# Patient Record
Sex: Male | Born: 1990 | Race: White | Hispanic: No | Marital: Single | Smoking: Never smoker
Health system: Southern US, Community
[De-identification: ages and names within clinical notes are randomized; demographics above are authoritative.]

## PROBLEM LIST (undated history)

## (undated) DIAGNOSIS — E079 Disorder of thyroid, unspecified: Secondary | ICD-10-CM

## (undated) DIAGNOSIS — B019 Varicella without complication: Secondary | ICD-10-CM

## (undated) DIAGNOSIS — M549 Dorsalgia, unspecified: Secondary | ICD-10-CM

## (undated) DIAGNOSIS — S0191XA Laceration without foreign body of unspecified part of head, initial encounter: Secondary | ICD-10-CM

## (undated) HISTORY — DX: Laceration without foreign body of unspecified part of head, initial encounter: S01.91XA

## (undated) HISTORY — DX: Disorder of thyroid, unspecified: E07.9

## (undated) HISTORY — DX: Varicella without complication: B01.9

## (undated) HISTORY — DX: Dorsalgia, unspecified: M54.9

---
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
## 2008-05-05 HISTORY — PX: WRIST SURGERY: SHX841

## 2008-07-14 ENCOUNTER — Ambulatory Visit: Payer: Self-pay | Admitting: Internal Medicine

## 2008-07-14 DIAGNOSIS — J310 Chronic rhinitis: Secondary | ICD-10-CM | POA: Insufficient documentation

## 2008-07-14 DIAGNOSIS — J309 Allergic rhinitis, unspecified: Secondary | ICD-10-CM | POA: Insufficient documentation

## 2008-07-14 DIAGNOSIS — J45909 Unspecified asthma, uncomplicated: Secondary | ICD-10-CM | POA: Insufficient documentation

## 2008-07-17 ENCOUNTER — Ambulatory Visit: Payer: Self-pay | Admitting: Internal Medicine

## 2008-08-14 ENCOUNTER — Telehealth (INDEPENDENT_AMBULATORY_CARE_PROVIDER_SITE_OTHER): Payer: Self-pay | Admitting: *Deleted

## 2009-01-08 ENCOUNTER — Inpatient Hospital Stay (HOSPITAL_COMMUNITY): Admission: AC | Admit: 2009-01-08 | Discharge: 2009-01-14 | Payer: Self-pay | Admitting: Emergency Medicine

## 2009-05-12 ENCOUNTER — Ambulatory Visit (HOSPITAL_COMMUNITY): Admission: RE | Admit: 2009-05-12 | Discharge: 2009-05-12 | Payer: Self-pay | Admitting: Orthopedic Surgery

## 2010-03-30 ENCOUNTER — Emergency Department (HOSPITAL_COMMUNITY): Admission: EM | Admit: 2010-03-30 | Discharge: 2010-03-30 | Payer: Self-pay | Admitting: Emergency Medicine

## 2010-07-21 LAB — COMPREHENSIVE METABOLIC PANEL
AST: 21 U/L (ref 0–37)
Alkaline Phosphatase: 82 U/L (ref 39–117)
CO2: 30 mEq/L (ref 19–32)
Chloride: 106 mEq/L (ref 96–112)
Creatinine, Ser: 0.84 mg/dL (ref 0.4–1.5)
GFR calc Af Amer: >60 mL/min (ref >60)
GFR calc non Af Amer: >60 mL/min (ref >60)
Total Bilirubin: 0.9 mg/dL (ref 0.3–1.2)

## 2010-07-21 LAB — CBC
HCT: 41 % (ref 39.0–52.0)
MCV: 89.1 fL (ref 78.0–100.0)
RBC: 4.6 MIL/uL (ref 4.22–5.81)
WBC: 4.9 10*3/uL (ref 4.0–10.5)

## 2010-08-09 LAB — CBC
HCT: 36.2 % — ABNORMAL LOW (ref 39.0–52.0)
HCT: 43.1 % (ref 39.0–52.0)
Hemoglobin: 12.1 g/dL — ABNORMAL LOW (ref 13.0–17.0)
Hemoglobin: 13.3 g/dL (ref 13.0–17.0)
MCHC: 34.8 g/dL (ref 30.0–36.0)
MCV: 90.2 fL (ref 78.0–100.0)
MCV: 90.2 fL (ref 78.0–100.0)
Platelets: 185 10*3/uL (ref 150–400)
Platelets: 216 10*3/uL (ref 150–400)
Platelets: 288 10*3/uL (ref 150–400)
RBC: 4.25 MIL/uL (ref 4.22–5.81)
RDW: 12.3 % (ref 11.5–15.5)
RDW: 12.5 % (ref 11.5–15.5)
WBC: 12.8 10*3/uL — ABNORMAL HIGH (ref 4.0–10.5)
WBC: 14.5 10*3/uL — ABNORMAL HIGH (ref 4.0–10.5)

## 2010-08-09 LAB — URINE CULTURE: Colony Count: NO GROWTH

## 2010-08-09 LAB — DIFFERENTIAL
Basophils Absolute: 0.1 K/uL (ref 0.0–0.1)
Basophils Relative: 1 % (ref 0–1)
Eosinophils Absolute: 0.1 K/uL (ref 0.0–0.7)
Eosinophils Relative: 1 % (ref 0–5)
Lymphocytes Relative: 10 % — ABNORMAL LOW (ref 12–46)
Lymphs Abs: 1.5 K/uL (ref 0.7–4.0)
Monocytes Absolute: 0.8 K/uL (ref 0.1–1.0)
Monocytes Relative: 6 % (ref 3–12)
Neutro Abs: 12 K/uL — ABNORMAL HIGH (ref 1.7–7.7)
Neutrophils Relative %: 83 % — ABNORMAL HIGH (ref 43–77)

## 2010-08-09 LAB — URINALYSIS, MICROSCOPIC ONLY
Glucose, UA: NEGATIVE mg/dL
Hgb urine dipstick: NEGATIVE
Ketones, ur: NEGATIVE mg/dL
Leukocytes, UA: NEGATIVE
Protein, ur: NEGATIVE mg/dL
pH: 8.5 — ABNORMAL HIGH (ref 5.0–8.0)

## 2010-08-09 LAB — BASIC METABOLIC PANEL
BUN: 5 mg/dL — ABNORMAL LOW (ref 6–23)
CO2: 25 mEq/L (ref 19–32)
Calcium: 9.3 mg/dL (ref 8.4–10.5)
Chloride: 105 mEq/L (ref 96–112)
Creatinine, Ser: 1.01 mg/dL (ref 0.4–1.5)
GFR calc Af Amer: >60 mL/min (ref >60)
GFR calc non Af Amer: >60 mL/min (ref >60)
Potassium: 3.4 mEq/L — ABNORMAL LOW (ref 3.5–5.1)
Potassium: 3.9 mEq/L (ref 3.5–5.1)
Sodium: 139 mEq/L (ref 135–145)

## 2010-08-15 ENCOUNTER — Emergency Department (HOSPITAL_COMMUNITY): Payer: No Typology Code available for payment source

## 2010-08-15 ENCOUNTER — Encounter (HOSPITAL_COMMUNITY): Payer: Self-pay | Admitting: Radiology

## 2010-08-15 ENCOUNTER — Observation Stay (HOSPITAL_COMMUNITY)
Admission: EM | Admit: 2010-08-15 | Discharge: 2010-08-16 | Disposition: A | Discharge status: Home / Self Care | Payer: No Typology Code available for payment source | Attending: General Surgery | Admitting: General Surgery

## 2010-08-15 DIAGNOSIS — R079 Chest pain, unspecified: Secondary | ICD-10-CM | POA: Insufficient documentation

## 2010-08-15 DIAGNOSIS — S060X9A Concussion with loss of consciousness of unspecified duration, initial encounter: Principal | ICD-10-CM | POA: Insufficient documentation

## 2010-08-15 DIAGNOSIS — Y92410 Unspecified street and highway as the place of occurrence of the external cause: Secondary | ICD-10-CM | POA: Insufficient documentation

## 2010-08-15 DIAGNOSIS — M25569 Pain in unspecified knee: Secondary | ICD-10-CM | POA: Insufficient documentation

## 2010-08-15 DIAGNOSIS — S0100XA Unspecified open wound of scalp, initial encounter: Secondary | ICD-10-CM | POA: Insufficient documentation

## 2010-08-15 DIAGNOSIS — J45909 Unspecified asthma, uncomplicated: Secondary | ICD-10-CM | POA: Insufficient documentation

## 2010-08-15 LAB — COMPREHENSIVE METABOLIC PANEL
AST: 37 U/L (ref 0–37)
Albumin: 4.3 g/dL (ref 3.5–5.2)
CO2: 29 mEq/L (ref 19–32)
Calcium: 9.4 mg/dL (ref 8.4–10.5)
Creatinine, Ser: 0.96 mg/dL (ref 0.4–1.5)
GFR calc Af Amer: >60 mL/min (ref >60)
GFR calc non Af Amer: >60 mL/min (ref >60)

## 2010-08-15 LAB — SAMPLE TO BLOOD BANK

## 2010-08-15 LAB — CBC
HCT: 40.9 % (ref 39.0–52.0)
Hemoglobin: 14.3 g/dL (ref 13.0–17.0)
RBC: 4.55 MIL/uL (ref 4.22–5.81)
WBC: 5.1 10*3/uL (ref 4.0–10.5)

## 2010-08-15 LAB — POCT I-STAT, CHEM 8
Chloride: 102 mEq/L (ref 96–112)
HCT: 44 % (ref 39.0–52.0)
Potassium: 4.5 mEq/L (ref 3.5–5.1)

## 2010-08-15 LAB — LACTIC ACID, PLASMA: Lactic Acid, Venous: 1.5 mmol/L (ref 0.5–2.2)

## 2010-08-15 LAB — PROTIME-INR: Prothrombin Time: 12.6 seconds (ref 11.6–15.2)

## 2010-08-16 LAB — BASIC METABOLIC PANEL
BUN: 5 mg/dL — ABNORMAL LOW (ref 6–23)
Chloride: 105 mEq/L (ref 96–112)
Potassium: 3.7 mEq/L (ref 3.5–5.1)
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
## 2010-09-20 NOTE — Op Note (Signed)
NAMEAMON, COSTILLA             ACCOUNT NO.:  [id number]   MEDICAL RECORD NO.:  [id number]          PATIENT TYPE:  AMB   LOCATION:  DSC                          FACILITY:  [facility]   PHYSICIAN:  Loreta Ave, M.D. DATE OF BIRTH:  1990/09/24   DATE OF PROCEDURE:  02/11/2006  DATE OF DISCHARGE:                                 OPERATIVE REPORT   PREOPERATIVE DIAGNOSIS:  Lateral meniscus tear, right knee.   POSTOPERATIVE DIAGNOSIS:  Lateral meniscus tear, right knee.   PROCEDURE:  Right knee exam under anesthesia, arthroscopy, partial lateral  meniscectomy.   SURGEON:  Loreta Ave, MD   ASSISTANT:  Genene Churn. Barry Dienes, [place]   ANESTHESIA:  General.   BLOOD LOSS:  Minimal.   TOURNIQUET:  Not employed.   SPECIMENS:  None.   CULTURES:  None.   COMPLICATIONS:  None.   DRESSING:  Soft compressive.   PROCEDURE:  The patient was brought to the operating room and placed on the  operating table in supine position.  After adequate   anesthesia had been  obtained, knee examined.  Positive lateral McMurray's.  Full motion.  Stable  ligaments.  Tourniquet and leg holder applied.  Leg prepped and draped in  the usual sterile fashion.  Three portals created, 1 superolateral, 1 each  medial and lateral parapatellar.  In flow catheter introduced into the knee,  standard arthroscope introduced, and knee inspected.  Complex tearing,  lateral meniscus, radial tear, extending into the back with part of the  meniscus folded under itself.  Saucerized out to a stable margin over the  region of the radial tear and displaced tears.  This left a bridge of  meniscus in the middle, retaining the posterior and anterior thirds.  Tapered smoothly.  Old meniscal fragments left behind, nice and stable.  Remaining knee examined.  Articular cartilage,  medial meniscus, cruciate ligaments, patellofemoral joint all normal.  Instruments and fluid removed.  Portals of knee injected with Marcaine.  Portals  closed with 4-0 nylon.  Sterile compressive dressing applied.  Anesthesia reversed.  Brought to recovery room.  Tolerated the surgery well.  No complications.      Loreta Ave, M.D.  Electronically Signed     DFM/MEDQ  D:  02/11/2006  T:  02/12/2006  Job:  [id number]

## 2012-12-01 LAB — TSH: TSH: 13.6 u[IU]/mL — AB (ref <=5.90)

## 2013-06-16 LAB — TSH: TSH: 8.31 u[IU]/mL — AB (ref <=5.90)

## 2013-09-03 ENCOUNTER — Emergency Department: Payer: Self-pay

## 2014-01-18 LAB — HEPATIC FUNCTION PANEL
ALK PHOS: 68 U/L (ref 25–125)
ALT: 40 U/L (ref 10–40)
AST: 33 U/L (ref 14–40)

## 2014-01-18 LAB — CBC AND DIFFERENTIAL: Hemoglobin: 15.6 g/dL (ref 13.5–17.5)

## 2014-01-18 LAB — LIPID PANEL
Cholesterol: 213 mg/dL — AB (ref 0–200)
HDL: 53 mg/dL (ref 35–70)
LDL CALC: 131 mg/dL
TRIGLYCERIDES: 147 mg/dL (ref 40–160)

## 2014-01-18 LAB — BASIC METABOLIC PANEL
BUN: 16 mg/dL (ref 4–21)
Creatinine: 1.2 mg/dL (ref 0.6–1.3)
GLUCOSE: 91 mg/dL
Potassium: 4.4 mmol/L (ref 3.4–5.3)
SODIUM: 142 mmol/L (ref 137–147)

## 2014-01-18 LAB — TSH: TSH: 40.92 u[IU]/mL — AB (ref 0.41–5.90)

## 2014-01-26 ENCOUNTER — Ambulatory Visit: Payer: BLUE CROSS/BLUE SHIELD | Admitting: Endocrinology

## 2014-02-01 ENCOUNTER — Encounter: Payer: Self-pay | Admitting: Endocrinology

## 2014-02-01 ENCOUNTER — Ambulatory Visit (INDEPENDENT_AMBULATORY_CARE_PROVIDER_SITE_OTHER): Payer: BLUE CROSS/BLUE SHIELD | Admitting: Endocrinology

## 2014-02-01 ENCOUNTER — Other Ambulatory Visit: Payer: Self-pay | Admitting: Endocrinology

## 2014-02-01 VITALS — BP 118/74 | HR 65 | Resp 14 | Ht 70.25 in | Wt 226.0 lb

## 2014-02-01 DIAGNOSIS — E039 Hypothyroidism, unspecified: Secondary | ICD-10-CM | POA: Insufficient documentation

## 2014-02-01 LAB — T3, FREE: T3, Free: 3.5 pg/mL (ref 2.3–4.2)

## 2014-02-01 LAB — T4, FREE: FREE T4: 0.7 ng/dL (ref 0.60–1.60)

## 2014-02-01 NOTE — Patient Instructions (Signed)
  Labs today. Following labs, would expect that you will be started on levothyroxine.  This is thyroid hormone tablet, to be taken as instructed on an empty stomach separate from other pills and multivitamins.   Please come back for a follow-up appointment in 2 months.  Come off the Testosterone supplements.

## 2014-02-01 NOTE — Progress Notes (Signed)
Pre visit review using our clinic review tool, if applicable. No additional management support is needed unless otherwise documented below in the visit note. 

## 2014-02-01 NOTE — Assessment & Plan Note (Signed)
Discussed with the patient regarding thyroid hormone physiology, symptoms and signs of hypothyroidism and possible autoimmune etiology of hypothyroidism.   His last labs c/w hypothyroidism most likely from autoimmune causes.  Will update his thyroid panel together with thyroid Ab levels.  If TSH >10, then will start him on levothyroxine daily.  Explained correct administration of the medication.  Discussed at length about stopping all supplements use, especially use of Testosterone and discussed harmful effects of such supplements.  He has agreed to stop using them for the next 2 months.   RTC 2 months.

## 2014-02-01 NOTE — Progress Notes (Signed)
Reason for visit: Hypothyroidism  HPI  [name] is a 23 y.o.-year-old male, referred by Annitta Needs and Greig Right PA, from [facility],  for evaluation for hypothyroidism. He is here with a friend.   Patient recalls being diagnosed with hypothyroidism in 2014 following blood work. Following this, his levels were monitored without levothyroxine and he reports that his levels were improved on subsequent check, but still elevated. He was started on a "medication for 30 days " to see if they improve. He reports taking the medication for about 10 days then and hasn't taken any since then. Currently, not on any thyroid hormone. TSH levels this month have been elevated to the 40 range.   Denies any recent change in medical history, hospitalizations. Denies using any iodine or kelp supplements. No steroid use recently. He does lift weights and has been using various supplements for body building ( pre workout Dr Sofie Rower and Mr. Sheppard Plumber, optimum nutrition multivitamin, Testosterone booster and enhancement Testosterone pills and protein supplement). Has been using supplements on and off for the past 6-8 months. Has on Testosterone supplement again for the past month. The patient is a Emergency planning/management officer in [place].   I reviewed pt's thyroid tests: Lab Results  Component Value Date   TSH 40.92* 01/18/2014   TSH 8.31* 06/16/2013   TSH 13.60* 12/01/2012   Other pertinent labs 01/17/14: Total T4 8, T3U 26, FTI 2.1  Review of systems: [ x  ] complains of    [  ] denies [  ] weight gain  [  ] constipation  [  ] fatigue   [  ] dry skin  [  ] cold intolerance [  ] hair loss [  ] noticing any enlargement in size of thyroid [  ] lumps in neck [  ] dysphagia [  ] change in voice [  ]  SOB with lying down.  he denies any family history of  thyroid disorders in. No Family history of thyroid cancer.  No history of XRT to head or neck.   I reviewed his chart and he is otherwise healthy. No past  medical history on file. No past surgical history on file. History   Social History  . Marital Status: Single    Spouse Name: N/A    Number of Children: N/A  . Years of Education: N/A   Occupational History  . Not on file.   Social History Main Topics  . Smoking status: Never Smoker   . Smokeless tobacco: Current User  . Alcohol Use: Yes  . Drug Use: No  . Sexual Activity: Not on file   Other Topics Concern  . Not on file   Social History Narrative  . No narrative on file   No current outpatient prescriptions on file prior to visit.   No current facility-administered medications on file prior to visit.   No Known Allergies No family history on file.   Review of Systems: [x]  complains of  [  ] denies General:   [  ] Recent weight change [  ] Fatigue  [  ] Loss of appetite Eyes: [ x ]  Vision Difficulty [  ]  Eye pain ENT: [  ]  Hearing difficulty [  ]  Difficulty Swallowing CVS: [  ] Chest pain [  ]  Palpitations/Irregular Heart beat [  ]  Shortness of breath lying flat [  ] Swelling of legs Resp: [  ] Frequent Cough [  ]  Shortness of Breath  [  ]  Wheezing GI: [  ] Heartburn  [  ] Nausea or Vomiting  [  ] Diarrhea [  ] Constipation  [  ] Abdominal Pain GU: [  ]  Polyuria  [  ]  nocturia Bones/joints:  [  ]  Muscle aches  [ x ] Joint Pain  [ x ] Bone pain Skin/Hair/Nails: [  ]  Rash  [  ] New stretch marks [  ]  Itching [  ] Hair loss [  ]  Excessive hair growth Reproduction: [  ] Low sexual desire , [  ]  Women: Menstrual cycle problems [  ]  Women: Breast Discharge [  ] Men: Difficulty with erections [  ]  Men: Enlarged Breasts CNS: [  ] Frequent Headaches [  ] Blurry vision [  ] Tremors [  ] Seizures [  ] Loss of consciousness [  ] Localized weakness Endocrine: [  ]  Excess thirst [  ]  Feeling excessively hot [  ]  Feeling excessively cold Heme: [  ]  Easy bruising [  ]  Enlarged glands or lumps in neck Allergy: [  ]  Food allergies [  ] Environmental  allergies  PE: BP 118/74  Pulse 65  Resp 14  Ht 5' 10.25" (1.784 m)  Wt 226 lb (102.513 kg)  BMI 32.21 kg/m2  SpO2 98% Wt Readings from Last 3 Encounters:  02/01/14 226 lb (102.513 kg)  07/14/08 213 lb 4 oz (96.73 kg) (96%*, Z = 1.74)   * Growth percentiles are based on CDC 2-20 Years data.    HEENT: [place]/AT, EOMI, no icterus, no proptosis, no chemosis, no mild lid lag, no retraction, eyes close completely Neck: thyroid gland - smooth, mild enlargement, non-tender, no erythema, no tracheal deviation; negative Pemberton's sign; no lympahadenopathy; no bruits Lungs: good air entry, clear bilaterally Heart: S1&S2 normal, regular rate & rhythm; no murmurs, rubs or gallops Abd: soft, NT, ND, no HSM, +BS, no abnormal striae Ext: min tremor in hands bilaterally, no edema, 2+ DP/PT pulses, good muscle mass Neuro: normal gait, 2+ reflexes bilaterally, normal 5/5 strength, no proximal myopathy  Derm: no pretibial myxoedema/skin dryness   ASSESSMENT: 1. Hypothyroidism, uncontrolled , new diagnosis  PLAN:    Problem List Items Addressed This Visit     Endocrine   Unspecified hypothyroidism - Primary     Discussed with the patient regarding thyroid hormone physiology, symptoms and signs of hypothyroidism and possible autoimmune etiology of hypothyroidism.   His last labs c/w hypothyroidism most likely from autoimmune causes.  Will update his thyroid panel together with thyroid Ab levels.  If TSH >10, then will start him on levothyroxine daily.  Explained correct administration of the medication.  Discussed at length about stopping all supplements use, especially use of Testosterone and discussed harmful effects of such supplements.  He has agreed to stop using them for the next 2 months.   RTC 2 months.     Relevant Orders      Thyroid peroxidase antibody      T4, free      T3, free      Thyroid Panel With TSH       -RTC in 2 months   Blayne Frankie [facility]  02/01/2014    2:18 PM

## 2014-02-02 ENCOUNTER — Other Ambulatory Visit: Payer: Self-pay | Admitting: Endocrinology

## 2014-02-02 DIAGNOSIS — E038 Other specified hypothyroidism: Secondary | ICD-10-CM

## 2014-02-02 LAB — THYROID PEROXIDASE ANTIBODY: THYROID PEROXIDASE ANTIBODY: 299 [IU]/mL — AB (ref <9)

## 2014-02-02 LAB — THYROID PANEL WITH TSH
FREE THYROXINE INDEX: 1.6 (ref 1.4–3.8)
T3 UPTAKE: 25 % (ref 22.0–35.0)
T4 TOTAL: 6.4 ug/dL (ref 4.5–12.0)
TSH: 9.147 u[IU]/mL — ABNORMAL HIGH (ref 0.350–4.500)

## 2014-02-02 MED ORDER — LEVOTHYROXINE SODIUM 50 MCG PO TABS
50.0000 ug | ORAL_TABLET | Freq: Every day | ORAL | Status: DC
Start: 2014-02-02 — End: 2014-04-04

## 2014-04-04 ENCOUNTER — Encounter (INDEPENDENT_AMBULATORY_CARE_PROVIDER_SITE_OTHER): Payer: Self-pay

## 2014-04-04 ENCOUNTER — Encounter: Payer: Self-pay | Admitting: Endocrinology

## 2014-04-04 ENCOUNTER — Ambulatory Visit (INDEPENDENT_AMBULATORY_CARE_PROVIDER_SITE_OTHER): Payer: BLUE CROSS/BLUE SHIELD | Admitting: Endocrinology

## 2014-04-04 ENCOUNTER — Other Ambulatory Visit: Payer: Self-pay | Admitting: Endocrinology

## 2014-04-04 VITALS — BP 118/72 | HR 78 | Wt 229.5 lb

## 2014-04-04 DIAGNOSIS — E038 Other specified hypothyroidism: Secondary | ICD-10-CM

## 2014-04-04 LAB — TSH: TSH: 3.14 u[IU]/mL (ref 0.35–4.50)

## 2014-04-04 LAB — T4, FREE: Free T4: 0.99 ng/dL (ref 0.60–1.60)

## 2014-04-04 MED ORDER — LEVOTHYROXINE SODIUM 50 MCG PO TABS
50.0000 ug | ORAL_TABLET | Freq: Every day | ORAL | Status: DC
Start: 2014-04-04 — End: 2018-02-04

## 2014-04-04 NOTE — Progress Notes (Signed)
Pre visit review using our clinic review tool, if applicable. No additional management support is needed unless otherwise documented below in the visit note. 

## 2014-04-04 NOTE — Patient Instructions (Signed)
Continue to stay off muscle building supplements.  Continue current levothyroxine.  Labs today.  Adjust dose based on labs.  Please come back for a follow-up appointment in 6 months

## 2014-04-04 NOTE — Assessment & Plan Note (Signed)
  Appears clinically euthyroid with a small goiter , without palpable nodules.  Check labs today for assessing current dose of levothyroxine.  If TSH, free T4 are in normal range, then continue current levothyroxine, otherwise adjust medication per labs.  He was asked to continue to stay off the Testosterone boosters.    RTC 6 months.

## 2014-04-04 NOTE — Progress Notes (Signed)
Reason for visit: Hypothyroidism  HPI  [name] is a [age] y.o.-year-old male, here for follow up of hypothyroidism.  Diagnosed with hypothyroidism in 2014 following blood work. Had taken levothyroxine briefly then. Last seen by  Me 2 months ago. Started on levothyroxine 50 mcg daily (Sept 2015) when TSH was still elevated. Taking it correctly, but at different times of the day per his work schedule. Thinks may have missed only 1 dose so far.   Has not been using any additional OTC supplements since last time.    He does lift weights and had been using various supplements for body building ( pre workout Dr Sofie RowerJekyl and Mr. Sheppard PlumberHyde, optimum nutrition multivitamin, Testosterone booster and enhancement Testosterone pills and protein supplement). 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   I reviewed pt's thyroid tests: Lab Results  Component Value Date   TSH 9.147* 02/01/2014   TSH 40.92* 01/18/2014   TSH 8.31* 06/16/2013   TSH 13.60* 12/01/2012   FREET4 0.70 02/01/2014   Other pertinent labs 01/17/14: Total T4 8, T3U 26, FTI 2.1 02/01/14: TPO elevated at 299.  Review of systems: [ x  ] complains of    [  ] denies [  ] weight gain  [  ] constipation  [  ] fatigue   [  ] dry skin  [  ] cold intolerance [  ] hair loss [  ] noticing any enlargement in size of thyroid [  ] lumps in neck [  ] dysphagia [  ] change in voice [  ]  SOB with lying down.  he denies any family history of  thyroid disorders in. No Family history of thyroid cancer.  No history of XRT to head or neck.   I have reviewed the patient's past medical history, medications and allergies.     Current Outpatient Prescriptions on File Prior to Visit  Medication Sig Dispense Refill  . cetirizine (ZYRTEC) 10 MG tablet Take 10 mg by mouth daily as needed for allergies.    Marland Kitchen. levothyroxine (SYNTHROID, LEVOTHROID) 50 MCG tablet Take 1 tablet (50 mcg total) by mouth daily before breakfast. 30  tablet 6  . Multiple Vitamin (MULTIVITAMIN) tablet Take 1 tablet by mouth daily.     No current facility-administered medications on file prior to visit.   No Known Allergies   PE: BP 118/72 mmHg  Pulse 78  Wt 229 lb 8 oz (104.101 kg)  SpO2 97% Wt Readings from Last 3 Encounters:  04/04/14 229 lb 8 oz (104.101 kg)  02/01/14 226 lb (102.513 kg)  07/14/08 213 lb 4 oz (96.73 kg) (96 %*, Z = 1.74)   * Growth percentiles are based on CDC 2-20 Years data.    HEENT: [place]/AT, EOMI, no icterus, no proptosis, no chemosis, no mild lid lag, no retraction, eyes close completely Neck: thyroid gland - smooth, mild enlargement, non-tender, no erythema, no tracheal deviation; negative Pemberton's sign; no lympahadenopathy; no bruits Lungs: good air entry, clear bilaterally Heart: S1&S2 normal, regular rate & rhythm; no murmurs, rubs or gallops Ext: min tremor in hands bilaterally, no edema, 2+ DP/PT pulses, good muscle mass Neuro: normal gait, 2+ reflexes bilaterally, normal 5/5 strength, no proximal myopathy  Derm: no pretibial myxoedema/skin dryness   ASSESSMENT: 1. Hypothyroidism, uncontrolled   PLAN:    Problem List Items Addressed This Visit      Endocrine   Hypothyroidism - Primary     Appears clinically euthyroid  with a small goiter , without palpable nodules.  Check labs today for assessing current dose of levothyroxine.  If TSH, free T4 are in normal range, then continue current levothyroxine, otherwise adjust medication per labs.  He was asked to continue to stay off the Testosterone boosters.    RTC 6 months.       Relevant Orders      TSH      T4, free       -RTC in 6 months   [name]  04/04/2014   11:26 AM

## 2014-10-04 ENCOUNTER — Ambulatory Visit: Payer: Self-pay | Admitting: Endocrinology

## 2016-07-25 DIAGNOSIS — M5136 Other intervertebral disc degeneration, lumbar region: Secondary | ICD-10-CM | POA: Diagnosis not present

## 2016-07-25 DIAGNOSIS — Z8781 Personal history of (healed) traumatic fracture: Secondary | ICD-10-CM | POA: Diagnosis not present

## 2016-07-25 DIAGNOSIS — G8929 Other chronic pain: Secondary | ICD-10-CM | POA: Diagnosis not present

## 2016-07-25 DIAGNOSIS — M5442 Lumbago with sciatica, left side: Secondary | ICD-10-CM | POA: Diagnosis not present

## 2016-08-11 DIAGNOSIS — M549 Dorsalgia, unspecified: Secondary | ICD-10-CM | POA: Diagnosis not present

## 2016-08-11 DIAGNOSIS — R030 Elevated blood-pressure reading, without diagnosis of hypertension: Secondary | ICD-10-CM | POA: Diagnosis not present

## 2016-08-11 DIAGNOSIS — Z6827 Body mass index (BMI) 27.0-27.9, adult: Secondary | ICD-10-CM | POA: Diagnosis not present

## 2016-09-10 DIAGNOSIS — R030 Elevated blood-pressure reading, without diagnosis of hypertension: Secondary | ICD-10-CM | POA: Diagnosis not present

## 2016-09-10 DIAGNOSIS — Z6829 Body mass index (BMI) 29.0-29.9, adult: Secondary | ICD-10-CM | POA: Diagnosis not present

## 2016-09-10 DIAGNOSIS — M545 Low back pain: Secondary | ICD-10-CM | POA: Diagnosis not present

## 2016-09-10 DIAGNOSIS — M549 Dorsalgia, unspecified: Secondary | ICD-10-CM | POA: Diagnosis not present

## 2016-10-29 DIAGNOSIS — G8929 Other chronic pain: Secondary | ICD-10-CM | POA: Diagnosis not present

## 2016-10-29 DIAGNOSIS — M549 Dorsalgia, unspecified: Secondary | ICD-10-CM | POA: Diagnosis not present

## 2016-10-29 DIAGNOSIS — M545 Low back pain: Secondary | ICD-10-CM | POA: Diagnosis not present

## 2016-10-30 DIAGNOSIS — M545 Low back pain: Secondary | ICD-10-CM | POA: Diagnosis not present

## 2016-12-01 DIAGNOSIS — M545 Low back pain: Secondary | ICD-10-CM | POA: Diagnosis not present

## 2016-12-23 DIAGNOSIS — M545 Low back pain: Secondary | ICD-10-CM | POA: Diagnosis not present

## 2016-12-23 DIAGNOSIS — G8929 Other chronic pain: Secondary | ICD-10-CM | POA: Diagnosis not present

## 2016-12-23 DIAGNOSIS — Z6830 Body mass index (BMI) 30.0-30.9, adult: Secondary | ICD-10-CM | POA: Diagnosis not present

## 2017-01-08 DIAGNOSIS — K1321 Leukoplakia of oral mucosa, including tongue: Secondary | ICD-10-CM | POA: Diagnosis not present

## 2017-02-06 ENCOUNTER — Ambulatory Visit: Payer: Self-pay | Admitting: Registered Nurse

## 2017-02-06 VITALS — BP 141/83 | HR 91 | Temp 98.5°F | Resp 16

## 2017-02-06 DIAGNOSIS — J301 Allergic rhinitis due to pollen: Secondary | ICD-10-CM

## 2017-02-06 DIAGNOSIS — J0101 Acute recurrent maxillary sinusitis: Secondary | ICD-10-CM

## 2017-02-06 DIAGNOSIS — H6692 Otitis media, unspecified, left ear: Secondary | ICD-10-CM

## 2017-02-06 MED ORDER — SALINE SPRAY 0.65 % NA SOLN: 1.0000 | NASAL | Status: DC | PRN

## 2017-02-06 MED ORDER — AMOXICILLIN-POT CLAVULANATE 875-125 MG PO TABS: 1.0000 | ORAL_TABLET | Freq: Two times a day (BID) | ORAL | 0 refills | Status: AC

## 2017-02-06 MED ORDER — ACETAMINOPHEN 500 MG PO TABS: 1000.0000 mg | ORAL_TABLET | Freq: Four times a day (QID) | ORAL | 0 refills | Status: AC | PRN

## 2017-02-06 MED ORDER — MONTELUKAST SODIUM 10 MG PO TABS: 10.0000 mg | ORAL_TABLET | Freq: Every day | ORAL | 3 refills | Status: DC

## 2017-02-06 NOTE — Patient Instructions (Addendum)
Otitis Media, Adult Otitis media is redness, soreness, and puffiness (swelling) in the space just behind your eardrum (middle ear). It may be caused by allergies or infection. It often happens along with a cold. Follow these instructions at home:  Take your medicine as told. Finish it even if you start to feel better.  Only take over-the-counter or prescription medicines for pain, discomfort, or fever as told by your doctor.  Follow up with your doctor as told. Contact a doctor if:  You have otitis media only in one ear, or bleeding from your nose, or both.  You notice a lump on your neck.  You are not getting better in 3-5 days.  You feel worse instead of better. Get help right away if:  You have pain that is not helped with medicine.  You have puffiness, redness, or pain around your ear.  You get a stiff neck.  You cannot move part of your face (paralysis).  You notice that the bone behind your ear hurts when you touch it. This information is not intended to replace advice given to you by your health care provider. Make sure you discuss any questions you have with your health care provider. Document Released: 10/08/2007 Document Revised: 09/27/2015 Document Reviewed: 11/16/2012 Elsevier Interactive Patient Education  2017 [employer] Sinusitis, Adult Sinusitis is soreness and inflammation of your sinuses. Sinuses are hollow spaces in the bones around your face. Your sinuses are located:  Around your eyes.  In the middle of your forehead.  Behind your nose.  In your cheekbones.  Your sinuses and nasal passages are lined with a stringy fluid (mucus). Mucus normally drains out of your sinuses. When your nasal tissues become inflamed or swollen, the mucus can become trapped or blocked so air cannot flow through your sinuses. This allows bacteria, viruses, and funguses to grow, which leads to infection. Sinusitis can develop quickly and last for 7?10 days (acute) or for  more than 12 weeks (chronic). Sinusitis often develops after a cold. What are the causes? This condition is caused by anything that creates swelling in the sinuses or stops mucus from draining, including:  Allergies.  Asthma.  Bacterial or viral infection.  Abnormally shaped bones between the nasal passages.  Nasal growths that contain mucus (nasal polyps).  Narrow sinus openings.  Pollutants, such as chemicals or irritants in the air.  A foreign object stuck in the nose.  A fungal infection. This is rare.  What increases the risk? The following factors may make you more likely to develop this condition:  Having allergies or asthma.  Having had a recent cold or respiratory tract infection.  Having structural deformities or blockages in your nose or sinuses.  Having a weak immune system.  Doing a lot of swimming or diving.  Overusing nasal sprays.  Smoking.  What are the signs or symptoms? The main symptoms of this condition are pain and a feeling of pressure around the affected sinuses. Other symptoms include:  Upper toothache.  Earache.  Headache.  Bad breath.  Decreased sense of smell and taste.  A cough that may get worse at night.  Fatigue.  Fever.  Thick drainage from your nose. The drainage is often green and it may contain pus (purulent).  Stuffy nose or congestion.  Postnasal drip. This is when extra mucus collects in the throat or back of the nose.  Swelling and warmth over the affected sinuses.  Sore throat.  Sensitivity to light.  How is this diagnosed?  This condition is diagnosed based on symptoms, a medical history, and a physical exam. To find out if your condition is acute or chronic, your health care provider may:  Look in your nose for signs of nasal polyps.  Tap over the affected sinus to check for signs of infection.  View the inside of your sinuses using an imaging device that has a light attached (endoscope).  If your  health care provider suspects that you have chronic sinusitis, you may also:  Be tested for allergies.  Have a sample of mucus taken from your nose (nasal culture) and checked for bacteria.  Have a mucus sample examined to see if your sinusitis is related to an allergy.  If your sinusitis does not respond to treatment and it lasts longer than 8 weeks, you may have an MRI or CT scan to check your sinuses. These scans also help to determine how severe your infection is. In rare cases, a bone biopsy may be done to rule out more serious types of fungal sinus disease. How is this treated? Treatment for sinusitis depends on the cause and whether your condition is chronic or acute. If a virus is causing your sinusitis, your symptoms will go away on their own within 10 days. You may be given medicines to relieve your symptoms, including:  Topical nasal decongestants. They shrink swollen nasal passages and let mucus drain from your sinuses.  Antihistamines. These drugs block inflammation that is triggered by allergies. This can help to ease swelling in your nose and sinuses.  Topical nasal corticosteroids. These are nasal sprays that ease inflammation and swelling in your nose and sinuses.  Nasal saline washes. These rinses can help to get rid of thick mucus in your nose.  If your condition is caused by bacteria, you will be given an antibiotic medicine. If your condition is caused by a fungus, you will be given an antifungal medicine. Surgery may be needed to correct underlying conditions, such as narrow nasal passages. Surgery may also be needed to remove polyps. Follow these instructions at home: Medicines  Take, use, or apply over-the-counter and prescription medicines only as told by your health care provider. These may include nasal sprays.  If you were prescribed an antibiotic medicine, take it as told by your health care provider. Do not stop taking the antibiotic even if you start to feel  better. Hydrate and Humidify  Drink enough water to keep your urine clear or pale yellow. Staying hydrated will help to thin your mucus.  Use a cool mist humidifier to keep the humidity level in your home above 50%.  Inhale steam for 10-15 minutes, 3-4 times a day or as told by your health care provider. You can do this in the bathroom while a hot shower is running.  Limit your exposure to cool or dry air. Rest  Rest as much as possible.  Sleep with your head raised (elevated).  Make sure to get enough sleep each night. General instructions  Apply a warm, moist washcloth to your face 3-4 times a day or as told by your health care provider. This will help with discomfort.  Wash your hands often with soap and water to reduce your exposure to viruses and other germs. If soap and water are not available, use hand sanitizer.  Do not smoke. Avoid being around people who are smoking (secondhand smoke).  Keep all follow-up visits as told by your health care provider. This is important. Contact a health care provider  if:  You have a fever.  Your symptoms get worse.  Your symptoms do not improve within 10 days. Get help right away if:  You have a severe headache.  You have persistent vomiting.  You have pain or swelling around your face or eyes.  You have vision problems.  You develop confusion.  Your neck is stiff.  You have trouble breathing. This information is not intended to replace advice given to you by your health care provider. Make sure you discuss any questions you have with your health care provider. Document Released: 04/21/2005 Document Revised: 12/16/2015 Document Reviewed: 02/14/2015 Elsevier Interactive Patient Education  2017 [employer]. Sinus Rinse What is a sinus rinse? A sinus rinse is a simple home treatment that is used to rinse your sinuses with a sterile mixture of salt and water (saline solution). Sinuses are air-filled spaces in your skull  behind the bones of your face and forehead that open into your nasal cavity. You will use the following:  Saline solution.  Neti pot or spray bottle. This releases the saline solution into your nose and through your sinuses. Neti pots and spray bottles can be purchased at [employer], a health food store, or online.  When would I do a sinus rinse? A sinus rinse can help to clear mucus, dirt, dust, or pollen from the nasal cavity. You may do a sinus rinse when you have a cold, a virus, nasal allergy symptoms, a sinus infection, or stuffiness in the nose or sinuses. If you are considering a sinus rinse:  Ask your child's health care provider before performing a sinus rinse on your child.  Do not do a sinus rinse if you have had ear or nasal surgery, ear infection, or blocked ears.  How do I do a sinus rinse?  Wash your hands.  Disinfect your device according to the directions provided and then dry it.  Use the solution that comes with your device or one that is sold separately in stores. Follow the mixing directions on the package.  Fill your device with the amount of saline solution as directed by the device instructions.  Stand over a sink and tilt your head sideways over the sink.  Place the spout of the device in your upper nostril (the one closer to the ceiling).  Gently pour or squeeze the saline solution into the nasal cavity. The liquid should drain to the lower nostril if you are not overly congested.  Gently blow your nose. Blowing too hard may cause ear pain.  Repeat in the other nostril.  Clean and rinse your device with clean water and then air-dry it. Are there risks of a sinus rinse? Sinus rinse is generally very safe and effective. However, there are a few risks, which include:  A burning sensation in the sinuses. This may happen if you do not make the saline solution as directed. Make sure to follow all directions when making the saline  solution.  Infection from contaminated water. This is rare, but possible.  Nasal irritation.  This information is not intended to replace advice given to you by your health care provider. Make sure you discuss any questions you have with your health care provider. Document Released: 11/16/2013 Document Revised: 03/18/2016 Document Reviewed: 09/06/2013 Elsevier Interactive Patient Education  2017 [employer].

## 2017-02-06 NOTE — Progress Notes (Signed)
Subjective:    Patient ID: [name], male    DOB: 1991-04-07, 26 y.o.   MRN: [id number]  26y/o caucasian male new patient here for evaluation sinus pain pressure sore throat and ear pain x 1 week.  I have bad sinuses and allergies I switching from claritin to zyrtec, use flonase nasal and saline rinses year round. I also tried OTC chlortrimeton without any relief of symptoms. Tylenol and aleve at home for prn use. I can usually control it with this regimen. Has been years since he needed antibiotics--doxycycline and augmentin without problems in the past.  Works for [employer].  Denied need for work notes feels able to complete all duties      Review of Systems  Constitutional: Negative for activity change, appetite change, chills, diaphoresis, fatigue, fever and unexpected weight change.  HENT: Positive for congestion, ear pain, postnasal drip, rhinorrhea, sinus pain, sinus pressure and sore throat. Negative for dental problem, drooling, ear discharge, facial swelling, hearing loss, mouth sores, nosebleeds, sneezing, tinnitus, trouble swallowing and voice change.   Eyes: Negative for photophobia, pain, discharge, redness, itching and visual disturbance.  Respiratory: Negative for cough, choking, chest tightness, shortness of breath, wheezing and stridor.   Cardiovascular: Negative for chest pain, palpitations and leg swelling.  Gastrointestinal: Negative for abdominal distention, abdominal pain, blood in stool, constipation, diarrhea, nausea and vomiting.  Endocrine: Negative for cold intolerance and heat intolerance.  Genitourinary: Negative for dysuria.  Musculoskeletal: Negative for arthralgias, back pain, gait problem, joint swelling, myalgias, neck pain and neck stiffness.  Skin: Negative for color change, pallor, rash and wound.  Allergic/Immunologic: Positive for environmental allergies. Negative for food allergies and immunocompromised state.  Neurological:  Positive for headaches. Negative for dizziness, tremors, seizures, syncope, facial asymmetry, speech difficulty, weakness, light-headedness and numbness.  Hematological: Negative for adenopathy. Does not bruise/bleed easily.  Psychiatric/Behavioral: Negative for agitation, behavioral problems, confusion and sleep disturbance.       Objective:   Physical Exam  Constitutional: He is oriented to person, place, and time. Vital signs are normal. He appears well-developed and well-nourished. He is active and cooperative.  Non-toxic appearance. He does not have a sickly appearance. He appears ill. No distress.  HENT:  Head: Normocephalic and atraumatic.  Right Ear: Hearing, external ear and ear canal normal. A middle ear effusion is present.  Left Ear: Hearing and ear canal normal. There is tenderness. Tympanic membrane is injected, erythematous and bulging. A middle ear effusion is present.  Nose: Mucosal edema and rhinorrhea present. No nose lacerations, sinus tenderness, nasal deformity, septal deviation or nasal septal hematoma. No epistaxis.  No foreign bodies. Right sinus exhibits maxillary sinus tenderness. Right sinus exhibits no frontal sinus tenderness. Left sinus exhibits maxillary sinus tenderness. Left sinus exhibits no frontal sinus tenderness.  Mouth/Throat: Uvula is midline and mucous membranes are normal. Mucous membranes are not pale, not dry and not cyanotic. He does not have dentures. No oral lesions. No trismus in the jaw. Normal dentition. No dental abscesses, uvula swelling, lacerations or dental caries. Posterior oropharyngeal edema and posterior oropharyngeal erythema present. No oropharyngeal exudate or tonsillar abscesses.  Cobblestoning posterior pharynx; bilateral allergic shiners; oropharynx macular erythema; bilateral nasal turbinates edema/erythema clear discharge; left TM and auditory canal with erythema, bulging and tenderness with insertion otoscope; air fluid level  bilateral TMs clear  Eyes: Pupils are equal, round, and reactive to light. Conjunctivae, EOM and lids are normal. Right eye exhibits no chemosis, no discharge, no exudate and  no hordeolum. No foreign body present in the right eye. Left eye exhibits no chemosis, no discharge, no exudate and no hordeolum. No foreign body present in the left eye. Right conjunctiva is not injected. Right conjunctiva has no hemorrhage. Left conjunctiva is not injected. Left conjunctiva has no hemorrhage. No scleral icterus. Right eye exhibits normal extraocular motion and no nystagmus. Left eye exhibits normal extraocular motion and no nystagmus. Right pupil is round and reactive. Left pupil is round and reactive. Pupils are equal.  Neck: Trachea normal, normal range of motion and phonation normal. Neck supple. No tracheal tenderness and no muscular tenderness present. No neck rigidity. No tracheal deviation, no edema, no erythema and normal range of motion present. No thyroid mass and no thyromegaly present.  Cardiovascular: Normal rate, regular rhythm, S1 normal, S2 normal, normal heart sounds and intact distal pulses.  PMI is not displaced.  Exam reveals no gallop and no friction rub.   No murmur heard. Pulmonary/Chest: Effort normal and breath sounds normal. No accessory muscle usage or stridor. No respiratory distress. He has no decreased breath sounds. He has no wheezes. He has no rhonchi. He has no rales.  Spoke full sentences without difficulty; no cough observed in exam room  Abdominal: Soft. Normal appearance. He exhibits no distension, no fluid wave and no ascites. There is no rigidity and no guarding.  Musculoskeletal: Normal range of motion. He exhibits no edema or tenderness.       Right shoulder: Normal.       Left shoulder: Normal.       Right elbow: Normal.      Left elbow: Normal.       Right hip: Normal.       Left hip: Normal.       Right knee: Normal.       Left knee: Normal.       Cervical back:  Normal.       Thoracic back: Normal.       Lumbar back: Normal.       Right hand: Normal.       Left hand: Normal.  Lymphadenopathy:       Head (right side): No submental, no submandibular, no tonsillar, no preauricular, no posterior auricular and no occipital adenopathy present.       Head (left side): No submental, no submandibular, no tonsillar, no preauricular, no posterior auricular and no occipital adenopathy present.    He has no cervical adenopathy.       Right cervical: No superficial cervical, no deep cervical and no posterior cervical adenopathy present.      Left cervical: No superficial cervical, no deep cervical and no posterior cervical adenopathy present.  Neurological: He is alert and oriented to person, place, and time. He has normal strength. He displays no atrophy and no tremor. No cranial nerve deficit or sensory deficit. He exhibits normal muscle tone. He displays no seizure activity. Coordination and gait normal. GCS eye subscore is 4. GCS verbal subscore is 5. GCS motor subscore is 6.  On/off exam table without difficulty; gait sure and stable in hall  Skin: Skin is warm, dry and intact. No abrasion, no bruising, no burn, no ecchymosis, no laceration, no lesion, no petechiae and no rash noted. He is not diaphoretic. No cyanosis or erythema. No pallor. Nails show no clubbing.  Psychiatric: He has a normal mood and affect. His speech is normal and behavior is normal. Judgment and thought content normal. Cognition and memory are normal.  Nursing note and vitals reviewed.         Assessment & Plan:  A-acute maxillary sinusitis and left otitis media, seasonal allergic rhinitis  P-Continue flonase 1 spray each nostril BID, increase frequency saline 2 sprays each nostril q2h wa prn congestion.  augmentin  po BID x 10 days #20 RF0.  Electronic Rx given.  Denied personal or family history of ENT cancer.  Consider switching to nasacort if no improvement with singulair and  increased frequency nasal saline after 1 month trial/completing antibiotics. Shower BID especially prior to bed. No evidence of systemic bacterial infection, non toxic and well hydrated.  I do not see where any further testing or imaging is necessary at this time.   I will suggest supportive care, rest, good hygiene and encourage the patient to take adequate fluids.  The patient is to return to clinic or EMERGENCY ROOM if symptoms worsen or change significantly.  Exitcare handout on sinusitis and sinus rinse given to patient.  Patient verbalized agreement and understanding of treatment plan and had no further questions at this time.    P2:  Hand washing and cover cough   Electronic Rx singulair  po qhs #30 RF3 to pharmacy of his choice.  Patient may use normal saline nasal spray 2 sprays each nostril q2h wa as needed. flonase 1 spray each nostril BID #1 RF0.  Patient denied personal or family history of ENT cancer.  OTC antihistamine of choice claritin/zyrtec  po daily.  Avoid triggers if possible.  Shower prior to bedtime if exposed to triggers.  If allergic dust/dust mites recommend mattress/pillow covers/encasements; washing linens, vacuuming, sweeping, dusting weekly.  Call or return to clinic as needed if these symptoms worsen or fail to improve as anticipated.   Exitcare handout on allergic rhinitis and sinus rinse given to patient.  Patient verbalized understanding of instructions, agreed with plan of care and had no further questions at this time.  P2:  Avoidance and hand washing.  Augmentin  po BID x 10 days #20 RF0 electronic Rx.  Tylenol  po QID prn pain and/or aleve  po BID prn pain.  Supportive treatment.   No evidence of invasive bacterial infection, non toxic and well hydrated.  This is most likely self limiting viral infection.  I do not see where any further testing or imaging is necessary at this time.   I will suggest supportive care, rest, good hygiene and  encourage the patient to take adequate fluids.  The patient is to return to clinic or EMERGENCY ROOM if symptoms worsen or change significantly e.g. ear pain, fever, purulent discharge from ears or bleeding.  Exitcare handout on otitis media given to patient.  Patient verbalized agreement and understanding of treatment plan.  Patient had no further questions at this time.

## 2018-02-03 ENCOUNTER — Ambulatory Visit: Payer: BLUE CROSS/BLUE SHIELD | Admitting: Family Medicine

## 2018-02-03 ENCOUNTER — Encounter: Payer: Self-pay | Admitting: Family Medicine

## 2018-02-03 ENCOUNTER — Other Ambulatory Visit (HOSPITAL_COMMUNITY)
Admission: RE | Admit: 2018-02-03 | Discharge: 2018-02-03 | Disposition: A | Discharge status: Home / Self Care | Payer: BLUE CROSS/BLUE SHIELD | Source: Ambulatory Visit | Attending: Family Medicine | Admitting: Family Medicine

## 2018-02-03 VITALS — BP 110/82 | HR 73 | Temp 97.8°F | Ht 72.0 in | Wt 238.4 lb

## 2018-02-03 DIAGNOSIS — Z113 Encounter for screening for infections with a predominantly sexual mode of transmission: Secondary | ICD-10-CM

## 2018-02-03 DIAGNOSIS — Z13220 Encounter for screening for lipoid disorders: Secondary | ICD-10-CM

## 2018-02-03 DIAGNOSIS — Z114 Encounter for screening for human immunodeficiency virus [HIV]: Secondary | ICD-10-CM

## 2018-02-03 DIAGNOSIS — M545 Low back pain, unspecified: Secondary | ICD-10-CM

## 2018-02-03 DIAGNOSIS — Z118 Encounter for screening for other infectious and parasitic diseases: Secondary | ICD-10-CM | POA: Diagnosis not present

## 2018-02-03 DIAGNOSIS — M549 Dorsalgia, unspecified: Secondary | ICD-10-CM | POA: Insufficient documentation

## 2018-02-03 DIAGNOSIS — E038 Other specified hypothyroidism: Secondary | ICD-10-CM | POA: Diagnosis not present

## 2018-02-03 DIAGNOSIS — J4520 Mild intermittent asthma, uncomplicated: Secondary | ICD-10-CM | POA: Diagnosis not present

## 2018-02-03 DIAGNOSIS — R5383 Other fatigue: Secondary | ICD-10-CM | POA: Diagnosis not present

## 2018-02-03 DIAGNOSIS — G8929 Other chronic pain: Secondary | ICD-10-CM

## 2018-02-03 DIAGNOSIS — J310 Chronic rhinitis: Secondary | ICD-10-CM

## 2018-02-03 LAB — COMPREHENSIVE METABOLIC PANEL
ALT: 48 U/L (ref 0–53)
AST: 27 U/L (ref 0–37)
Albumin: 4.7 g/dL (ref 3.5–5.2)
Alkaline Phosphatase: 53 U/L (ref 39–117)
BUN: 17 mg/dL (ref 6–23)
CALCIUM: 9.5 mg/dL (ref 8.4–10.5)
CHLORIDE: 102 meq/L (ref 96–112)
CO2: 27 meq/L (ref 19–32)
Creatinine, Ser: 1.02 mg/dL (ref 0.40–1.50)
GFR: 91.74 mL/min (ref >60.00)
GLUCOSE: 95 mg/dL (ref 70–99)
Potassium: 4 mEq/L (ref 3.5–5.1)
Sodium: 138 mEq/L (ref 135–145)
Total Bilirubin: 0.8 mg/dL (ref 0.2–1.2)
Total Protein: 7.1 g/dL (ref 6.0–8.3)

## 2018-02-03 LAB — LIPID PANEL
Cholesterol: 211 mg/dL — ABNORMAL HIGH (ref 0–200)
HDL: 44.8 mg/dL (ref >39.00)
LDL CALC: 144 mg/dL — AB (ref 0–99)
NONHDL: 166.56
Total CHOL/HDL Ratio: 5
Triglycerides: 112 mg/dL (ref 0.0–149.0)
VLDL: 22.4 mg/dL (ref 0.0–40.0)

## 2018-02-03 LAB — CBC
HCT: 41.7 % (ref 39.0–52.0)
HEMOGLOBIN: 14.2 g/dL (ref 13.0–17.0)
MCHC: 34.1 g/dL (ref 30.0–36.0)
MCV: 89.1 fl (ref 78.0–100.0)
PLATELETS: 310 10*3/uL (ref 150.0–400.0)
RBC: 4.67 Mil/uL (ref 4.22–5.81)
RDW: 13 % (ref 11.5–15.5)
WBC: 5.7 10*3/uL (ref 4.0–10.5)

## 2018-02-03 LAB — TSH: TSH: 10.02 u[IU]/mL — AB (ref 0.35–4.50)

## 2018-02-03 MED ORDER — FLUTICASONE PROPIONATE 50 MCG/ACT NA SUSP: 2.0000 | Freq: Every day | NASAL | 6 refills | Status: DC

## 2018-02-03 NOTE — Assessment & Plan Note (Signed)
S:  Started on treatment several years ago (3-4 years). Usually gets bloodwork at work but they have switched nurses. Has seen specialist in past then later went to another doctors office to get refill when doctor moved. Didn't fit well with new doctor and hasnt seen in months and off medication for 3-4 months. Harder to lose weight since stopping. No cold intolerance or constipation.  A/P: will get TSH today with labs and restart levothyroxine- was on 50 mcg previously- if needed

## 2018-02-03 NOTE — Assessment & Plan Note (Signed)
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 Seemed to flare up with working out and hasnt done well since then. Has been to PT, another MD (possible orthopedist), had cortisone shot- helped 25% or so. Bothers him daily at this point. Takes aleve but doesn't want to take daily. No longer locking up like it was. Was told DDD. No radicular pain at present.  A/P: chronic low back pain. will refer to Dr. Berline Chough given long term pain in young healthy patient who wants to remain active to see if he can help with that goal.

## 2018-02-03 NOTE — Patient Instructions (Addendum)
Health Maintenance Due  Topic Date Due  . HIV Screening - today with labs 01/14/2004  . TETANUS/TDAP - unsure of last Tdap date- would you like to just update this today to be on safe side? 01/14/2008  . INFLUENZA VACCINE - declines 12/03/2017   Please schedule a visit with our excellent sports medicine physician Dr. Berline Chough before you leave at the check out desk so he can further evaluate your low back pain. Can also ask him about neck issues if they persist after using heat 20 minutes 3x a day  If thyroid is underactive- we will send in levothyroxine again for you  Checking testosterone as well as STDs at your request.  Trial Flonase for chronic runny nose and congestion in the morning  Lets make sure to see each other at least once a year for physical.  May need to see you sooner depending on labs

## 2018-02-03 NOTE — Assessment & Plan Note (Signed)
S:years of issues with this. Congested each AM- really stopped up and issues during day as well. Switches between antihistamines like zyrtec and claritin. Flonase mild help in past but not using.  A/P: continue antihistamine but add in flonase 2 sprays BID each nostril.

## 2018-02-03 NOTE — Progress Notes (Signed)
Phone: [phone]  Subjective:  Patient presents today to establish care. Last seen by [facility] provider in [place] in 2015- so new patient as over 3 years . Chief complaint-noted.   See problem oriented charting  The following were reviewed and entered/updated in epic: Past Medical History:  Diagnosis Date  . Back pain   . Chicken pox   . Laceration of head    short hospitalization due to this- from [facility]   Patient Active Problem List   Diagnosis Date Noted  . Hypothyroidism 02/01/2014    Priority: Medium  . Back pain 02/03/2018    Priority: Low  . Chronic rhinitis 07/14/2008    Priority: Low  . Asthma 07/14/2008    Priority: Low   Past Surgical History:  Procedure Laterality Date  . KNEE SURGERY Right 2008   meniscus  . WRIST SURGERY  2010   2 on wrist     Family History  Problem Relation Age of Onset  . Alcohol abuse Mother   . Healthy Father   . Healthy Brother   . Asthma Maternal Grandmother   . COPD Maternal Grandmother   . Depression Maternal Grandmother   . Hypertension Maternal Grandmother   . Heart disease Paternal Grandfather        CABG- unknown age  . Thyroid disease Neg Hx     Medications- reviewed and updated Current Outpatient Medications  Medication Sig Dispense Refill  . cetirizine (ZYRTEC) 10 MG tablet Take 10 mg by mouth daily as needed for allergies.    . Multiple Vitamin (MULTIVITAMIN) tablet Take 1 tablet by mouth daily.    Marland Kitchen levothyroxine (SYNTHROID, LEVOTHROID) 50 MCG tablet Take 1 tablet (50 mcg total) by mouth daily before breakfast. (Patient not taking: Reported on 02/03/2018) 30 tablet 6   Allergies-reviewed and updated No Known Allergies  Social History   Social History Narrative   Single but dating- GF few months. Lives with grandparents.       Journalist, newspaper   HS degree.       Hobbies: working out, outdoors    ROS--Full ROS was completed Review of Systems  Constitutional: Positive for  malaise/fatigue. Negative for chills and fever.  HENT: Negative for ear discharge and ear pain.   Eyes: Negative for discharge and redness.  Respiratory: Negative for shortness of breath and wheezing.   Cardiovascular: Negative for chest pain and palpitations.  Gastrointestinal: Negative for abdominal pain, constipation, diarrhea and vomiting.  Genitourinary: Negative for dysuria and urgency.  Musculoskeletal: Positive for back pain and myalgias (left trapezius after workout last week).  Skin: Negative for itching and rash.  Neurological: Negative for dizziness and headaches.  Endo/Heme/Allergies: Negative for polydipsia. Does not bruise/bleed easily.  Psychiatric/Behavioral: Negative for hallucinations and substance abuse.   Objective: BP 110/82 (BP Location: Left Arm, Patient Position: Sitting, Cuff Size: Large)   Pulse 73   Temp 97.8 F (36.6 C) (Oral)   Ht 6' (1.829 m)   Wt 238 lb 6.4 oz (108.1 kg)   SpO2 97%   BMI 32.33 kg/m  Gen: NAD, resting comfortably HEENT: Mucous membranes are moist. Oropharynx normal. TM normal. Eyes: sclera and lids normal, PERRLA Neck: no thyromegaly, no cervical lymphadenopathy CV: RRR no murmurs rubs or gallops Lungs: CTAB no crackles, wheeze, rhonchi Abdomen: soft/nontender/nondistended/normal bowel sounds. No rebound or guarding.  Ext: no edema Skin: warm, dry Neuro: 5/5 strength in upper and lower extremities, normal gait, normal reflexes MSK: some pain with palpation over left trapezius into neck.  ROM limited in neck by some pain. No pain with palpation over low back.   Assessment/Plan:  Other notes: 1.Screen STDs due to unprotected sex 2.  Advised him to quit snuff-he declines for now 3.  Declines flu shot today  Fatigue S: Ask for Testosterone check. Using OTC testosterone boosters. tired off these meds A/P: I advised him no further testosterone boosters.  Fatigue could be coming from thyroid or testosterone at his request.  Slightly  later than desired around 9:15.  Chronic rhinitis S:years of issues with this. Congested each AM- really stopped up and issues during day as well. Switches between antihistamines like zyrtec and claritin. Flonase mild help in past but not using.  A/P: continue antihistamine but add in flonase 2 sprays BID each nostril.    Hypothyroidism S:  Started on treatment several years ago (3-4 years). Usually gets bloodwork at work but they have switched nurses. Has seen specialist in past then later went to another doctors office to get refill when doctor moved. Didn't fit well with new doctor and hasnt seen in months and off medication for 3-4 months. Harder to lose weight since stopping. No cold intolerance or constipation.  A/P: will get TSH today with labs and restart levothyroxine- was on 50 mcg previously- if needed   Back pain S: Fell from deer stand 2010- issues with low back, pelvis, and wrist (2 operations). Seemed to flare up with working out and hasnt done well since then. Has been to PT, another MD (possible orthopedist), had cortisone shot- helped 25% or so. Bothers him daily at this point. Takes aleve but doesn't want to take daily. No longer locking up like it was. Was told DDD. No radicular pain at present.  A/P: chronic low back pain. will refer to Dr. Berline Chough given long term pain in young healthy patient who wants to remain active to see if he can help with that goal.   Lets make sure to see each other at least once a year for physical.  May need to see you sooner depending on labs  Lab/Order associations: Chronic rhinitis - Plan: CBC, Comprehensive metabolic panel  Chronic bilateral low back pain without sciatica - Plan: Ambulatory referral to Sports Medicine, CBC, Comprehensive metabolic panel  Other specified hypothyroidism - Plan: TSH  Mild intermittent asthma without complication  Fatigue, unspecified type - Plan: Testos,Total,Free and SHBG (Male)  Screening for  hyperlipidemia - Plan: Lipid panel  Screening for HIV (human immunodeficiency virus) - Plan: HIV Antibody (routine testing w rflx)  Screening examination for venereal disease - Plan: RPR  Screening for gonorrhea - Plan: Urine cytology ancillary only  Screening for chlamydial disease - Plan: Urine cytology ancillary only  Meds ordered this encounter  Medications  . fluticasone (FLONASE) 50 MCG/ACT nasal spray    Sig: Place 2 sprays into both nostrils daily.    Dispense:  16 g    Refill:  6   Return precautions advised. Tana Conch, MD

## 2018-02-03 NOTE — Addendum Note (Signed)
Addended by: Felix Ahmadi A on: 02/03/2018 09:15 AM   Modules accepted: Orders

## 2018-02-04 ENCOUNTER — Other Ambulatory Visit: Payer: Self-pay | Admitting: Family Medicine

## 2018-02-04 LAB — URINE CYTOLOGY ANCILLARY ONLY
Chlamydia: NEGATIVE
NEISSERIA GONORRHEA: NEGATIVE
Trichomonas: NEGATIVE

## 2018-02-04 MED ORDER — LEVOTHYROXINE SODIUM 50 MCG PO TABS: 50.0000 ug | ORAL_TABLET | Freq: Every day | ORAL | 6 refills | Status: DC

## 2018-02-04 NOTE — Progress Notes (Signed)
Refill levothyroxine

## 2018-02-09 LAB — RPR: RPR Ser Ql: NONREACTIVE

## 2018-02-09 LAB — TESTOS,TOTAL,FREE AND SHBG (FEMALE)
FREE TESTOSTERONE: 52.2 pg/mL (ref 35.0–155.0)
SEX HORMONE BINDING: 19 nmol/L (ref 10–50)
Testosterone, Total, LC-MS-MS: 257 ng/dL (ref 250–1100)

## 2018-02-09 LAB — HIV ANTIBODY (ROUTINE TESTING W REFLEX): HIV: NONREACTIVE

## 2018-02-11 ENCOUNTER — Ambulatory Visit: Payer: BLUE CROSS/BLUE SHIELD | Admitting: Sports Medicine

## 2018-02-12 ENCOUNTER — Other Ambulatory Visit: Payer: Self-pay

## 2018-02-12 DIAGNOSIS — E038 Other specified hypothyroidism: Secondary | ICD-10-CM

## 2018-02-18 ENCOUNTER — Ambulatory Visit: Payer: BLUE CROSS/BLUE SHIELD | Admitting: Sports Medicine

## 2018-02-18 ENCOUNTER — Ambulatory Visit (INDEPENDENT_AMBULATORY_CARE_PROVIDER_SITE_OTHER): Payer: BLUE CROSS/BLUE SHIELD

## 2018-02-18 ENCOUNTER — Encounter: Payer: Self-pay | Admitting: Sports Medicine

## 2018-02-18 VITALS — BP 118/88 | HR 73 | Ht 72.0 in | Wt 236.0 lb

## 2018-02-18 DIAGNOSIS — M24552 Contracture, left hip: Secondary | ICD-10-CM | POA: Diagnosis not present

## 2018-02-18 DIAGNOSIS — M545 Low back pain, unspecified: Secondary | ICD-10-CM

## 2018-02-18 DIAGNOSIS — G8929 Other chronic pain: Secondary | ICD-10-CM

## 2018-02-18 DIAGNOSIS — S3992XA Unspecified injury of lower back, initial encounter: Secondary | ICD-10-CM | POA: Diagnosis not present

## 2018-02-18 DIAGNOSIS — M479 Spondylosis, unspecified: Secondary | ICD-10-CM | POA: Diagnosis not present

## 2018-02-18 NOTE — Progress Notes (Signed)
[name]. [name] [facility] [phone]  [name] - 27 y.o. male MRN [id number]  Date of birth: February 18, 1991  Visit Date: 02/18/2018  PCP: Shelva Majestic, MD   Referred by: Shelva Majestic, MD   Scribe(s) for today's visit: Stevenson Clinch, CMA  SUBJECTIVE:  Ian Homer "Scott" is here for Initial Assessment (LBP)  Referred by: Dr. Tana Conch  HPI: 02/18/2018: His L-sided LBP symptoms INITIALLY: Began in 2010 after falling from a deer stand.  Described as mild aching daily but moderate-severe pinching with certain activities. Pain will occasionally radiate into the L leg.  Worsened with standing for long periods of time.  Improves with lying down.  Additional associated symptoms include: He reports that in the past when he would bend down to pick something up he feels like there is something catching in his back, the pain is so bad when it happens that he has fallen x 2. He denies loss of control of bladder or bowel functions.     At this time symptoms are worsening compared to onset. He has received epidural injection in the past, cannot recall who he saw, reports slight-moderate relief. He has also done PT in the past x 2 mos. He takes Aleve back pain daily with some relief. He has tried using TENs unit in the past with minimal relief. He has tried hot yoga in the past with minimal relief. He has also tried heat/ice with short term relief.   Pertinent hx - fell from deer stand in 2010, injured lower back, pelvis, and wrist. L sacral ala fx, L pubic ramus fx, L minimal acetabular fx, L3 compression/superior end-plate fx, Seen by Dr. Shon Baton and Dr. Amanda Pea. Non-surgical treatment for back/pelvis.  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   REVIEW OF SYSTEMS: Denies night time disturbances. Denies fevers, chills, or night sweats. Denies unexplained weight loss. Denies personal history of  cancer. Denies changes in bowel or bladder habits. Denies recent unreported falls. Denies new or worsening dyspnea or wheezing. Denies headaches or dizziness.  Reports numbness in his legs when sitting down, L>R.  Denies dizziness or presyncopal episodes Denies lower extremity edema    HISTORY:  Prior history reviewed and updated per electronic medical record.  Social History   Occupational History  . Not on file  Tobacco Use  . Smoking status: Never Smoker  . Smokeless tobacco: Current User    Types: Snuff  Substance and Sexual Activity  . Alcohol use: Yes    Alcohol/week: 0.0 - 2.0 standard drinks  . Drug use: No  . Sexual activity: Yes    Birth control/protection: Condom    Comment: not always   Social History   Social History Narrative   Single but dating- GF few months. Lives with grandparents.       Journalist, newspaper   HS degree.       Hobbies: working out, outdoors    Past Medical History:  Diagnosis Date  . Back pain   . Chicken pox   . Laceration of head    short hospitalization due to this- from [facility]   Past Surgical History:  Procedure Laterality Date  . KNEE SURGERY Right 2008   meniscus  . WRIST SURGERY  2010   2 on wrist   family history includes Alcohol abuse in his mother; Asthma in his maternal grandmother; COPD in his maternal grandmother; Depression in his maternal  grandmother; Healthy in his brother and father; Heart disease in his paternal grandfather; Hypertension in his maternal grandmother. There is no history of Thyroid disease.  DATA OBTAINED & REVIEWED:  No results for input(s): HGBA1C, LABURIC, CREATINE in the last 8760 hours. . 09/10/16 - Lumbar spine numbered as per prior MRI. Soft tissue structures are unremarkable. Mild scoliosis concave right. Minimal anterior wedging of L3 and L1 again noted. No interim change. No acute bony abnormality identified. Diffuse multilevel degenerative change .   OBJECTIVE:  VS:  HT:6'  (182.9 cm)   WT:236 lb (107 kg)  BMI:32    BP:118/88  HR:73bpm  TEMP: ( )  RESP:98 %   PHYSICAL EXAM: CONSTITUTIONAL: Well-developed, Well-nourished and In no acute distress PSYCHIATRIC: Alert & appropriately interactive. and Not depressed or anxious appearing. RESPIRATORY: No increased work of breathing and Trachea Midline EYES: Pupils are equal., EOM intact without nystagmus. and No scleral icterus.  VASCULAR EXAM: Warm and well perfused NEURO: unremarkable  MSK Exam: BACK Exam: Normal alignment & Contours Skin: No overlying erythema/ecchymosis  MOTOR TESTING: Intact in all LE myotomes and Able to heel and toe walk without difficutly       RIGHT    LEFT Straight leg raise-------------------------: positive, mild pain                         positive, mild pain Braggard Stretch Test------------------: normal, no pain                         normal, no pain Slump Sign--------------------------------: positive, mild pain                         positive, mild pain Popliteal compression test------------: normal, no pain                         normal, no pain Greater sciatic notch tenderness----: normal, no pain                         normal, no pain   REFLEXES Right Left  DTR - L3/4 -Patellar 2+ 2+  DTR - L5/S1 - Achilles 2+ 2+    ASSESSMENT   1. Chronic left-sided low back pain, unspecified whether sciatica present   2. Chronic bilateral low back pain without sciatica   3. Spondylosis   4. Left hip flexor tightness     PLAN:  Pertinent additional documentation may be included in corresponding procedure notes, imaging studies, problem based documentation and patient instructions.  Procedures:  . None  Medications:  No orders of the defined types were placed in this encounter.  Discussion/Instructions: No problem-specific Assessment & Plan notes found for this encounter.  . Left Hip flexor is tight and given prior sacral base fracture left hip flexor  contracture is likely chronic.  Needs to work on flexibility and core-stabilization.  . Discussed the underlying features of tight hip flexors leading to crouched, fetal like position that results in spinal column compression.  Including lumbar hyperflexion with hypermobility, thoracic flexion with restrictive rotation and cervical lordosis reversal  . Links to [employer] provided today per Patient Instructions.  These exercises were developed by Myles Lipps, DC with a strong emphasis on core neuromuscular reducation and postural realignment through body-weight exercises. . Discussed red flag symptoms that warrant earlier emergent evaluation and patient voices  understanding. . Activity modifications and the importance of avoiding exacerbating activities (limiting pain to no more than a 4 / 10 during or following activity) recommended and discussed.  Follow-up:  . Return in about 3 weeks (around 03/11/2018) for consideration of Osteopathic Manipulation.   . If any lack of improvement consider: further diagnostic evaluation with repeat MRI vs Osteopathic manipulation  . At follow up will plan to consider: ]     CMA/ATC served as scribe during this visit. History, Physical, and Plan performed by medical provider. Documentation and orders reviewed and attested to.      Andrena Mews, DO    [facility] Physician

## 2018-02-22 ENCOUNTER — Encounter: Payer: Self-pay | Admitting: Physical Therapy

## 2018-02-22 ENCOUNTER — Ambulatory Visit: Payer: BLUE CROSS/BLUE SHIELD | Admitting: Physical Therapy

## 2018-02-22 DIAGNOSIS — M545 Low back pain, unspecified: Secondary | ICD-10-CM

## 2018-02-22 DIAGNOSIS — G8929 Other chronic pain: Secondary | ICD-10-CM | POA: Diagnosis not present

## 2018-02-22 NOTE — Patient Instructions (Signed)
Access Code: [email or web address]  URL: [email or web address]  Date: 02/22/2018  Prepared by: Sedalia Muta   Exercises  Supine Single Knee to Chest - 3 reps - 30 hold - 2x daily  Seated Hamstring Stretch - 3 reps - 30 hold - 3x daily  TL Sidebending Stretch - Single Arm Overhead - 3 reps - 30 hold - 2x daily  Supine Posterior Pelvic Tilt - 10 reps - 2 sets - 2x daily

## 2018-02-23 ENCOUNTER — Encounter: Payer: Self-pay | Admitting: Physical Therapy

## 2018-02-23 NOTE — Therapy (Signed)
[facility] [address] [place], [place], [address] Phone: [phone]   Fax:  [phone]  Physical Therapy Evaluation  Patient Details  Name: [name] MRN: [id number] Date of Birth: 1990/09/29 Referring Provider (PT): Gaspar Bidding   Encounter Date: 02/22/2018  PT End of Session - 02/23/18 1333    Visit Number  1    Number of Visits  12    Date for PT Re-Evaluation  04/06/18    Authorization Type  BCBS    PT Start Time  1346    PT Stop Time  1428    PT Time Calculation (min)  42 min    Activity Tolerance  Patient tolerated treatment well    Behavior During Therapy  [facility] for tasks assessed/performed       Past Medical History:  Diagnosis Date  . Back pain   . Chicken pox   . Laceration of head    short hospitalization due to this- from [facility]    Past Surgical History:  Procedure Laterality Date  . KNEE SURGERY Right 2008   meniscus  . WRIST SURGERY  2010   2 on wrist    There were no vitals filed for this visit.   Subjective Assessment - 02/22/18 1351    Subjective  Pt had fall 2010 from deer stand, had multiple fractures.  2013 states he was running, and had increased pain, no injury to report, states increased pain since then.  Pt did have cortisone injection in back about a year ago, has not had significant pain relief.  Pt works full time as Emergency planning/management officer. Works out, cardio, [employer], is unable to do certian things due to back pain.  Pt states pain at L lower back. No LE pain at this time, has had previously.    Patient Stated Goals  Decreased pain     Currently in Pain?  Yes    Pain Score  2     Pain Location  Back    Pain Orientation  Left;Lower    Pain Type  Chronic pain    Pain Onset  More than a month ago    Pain Frequency  Intermittent    Aggravating Factors   Prolonged standing, Bending.     Effect of Pain on Daily Activities  Pain goes up to 8/10.          [facility] PT Assessment -  02/23/18 0001      Assessment   Medical Diagnosis  Back Pain    Referring Provider (PT)  Gaspar Bidding    Prior Therapy  No      Precautions   Precautions  None      Balance Screen   Has the patient fallen in the past 6 months  No      Prior Function   Level of Independence  Independent      Cognition   Overall Cognitive Status  Within Functional Limits for tasks assessed      Posture/Postural Control   Posture Comments  Poor seated posture, with posterior pelvic tilt.       AROM   Overall AROM Comments  Lumbar: flex: mild/mod deficit;  Ext: mild deficit:  L SB: WFL;  R SB : WFL/ pain on L;  HIps: WNL;        Strength   Overall Strength Comments  Hips: 4+/5;  Core: 4-/5;       Palpation   Palpation comment  Significant tightness and  limitation for bil hamstrings; Tightness and mild tenderness at L QL and L paraspinals; Mild soreness with PA mobs at high/mid lumbar region;       Special Tests   Other special tests  Neg SLR bil; Instability noted in lumbar/core;                 Objective measurements completed on examination: See above findings.      [facility] Adult PT Treatment/Exercise - 02/23/18 0001      Exercises   Exercises  Lumbar      Lumbar Exercises: Stretches   Active Hamstring Stretch  3 reps;30 seconds    Active Hamstring Stretch Limitations  seated    Single Knee to Chest Stretch  3 reps;30 seconds    Pelvic Tilt  20 reps    Other Lumbar Stretch Exercise  standing QL stretch 30 sec x3 bil;              PT Education - 02/23/18 1333    Education Details  HEP, PT POC     Person(s) Educated  Patient    Methods  Explanation;Handout;Verbal cues;Tactile cues    Comprehension  Verbalized understanding;Tactile cues required;Need further instruction;Verbal cues required       PT Short Term Goals - 02/23/18 1337      PT SHORT TERM GOAL #1   Title  Pt to be independent with initial HEP     Time  2    Period  Weeks    Status  New     Target Date  03/08/18      PT SHORT TERM GOAL #2   Title  Pt to report decreased pain in lumbar region to 4/10 at worst/ with activity     Time  2    Period  Weeks    Status  New    Target Date  03/08/18        PT Long Term Goals - 02/23/18 1338      PT LONG TERM GOAL #1   Title  Pt to report decreased pain in lumbar region, to 0-2/10 with activity and work duties.     Time  6    Period  Weeks    Status  New    Target Date  04/05/18      PT LONG TERM GOAL #2   Title  Pt to demo improved lumbar ROM to be [facility], to improve posture and ability for bending, lifting.     Time  6    Period  Weeks    Status  New    Target Date  04/05/18      PT LONG TERM GOAL #3   Title  Pt to demo improved strength of core and hips to be at least 4+/5, with no postural changes seen with stability exercises.     Time  6    Period  Weeks    Status  New    Target Date  04/05/18             Plan - 02/23/18 1342    Clinical Impression Statement  Pt presents with primary complaint of increased pain in L lumbar region. Pt with increased tightness in L lumbar musculature, and pain in upper/mid central lumbar region with spring testing and PA mobs. Pt with significant tightness in bil hamstrings, and has tight posterior chain. Pt with poor seated posture, and will benefit from education on this. Pt with lack of effective HEP for back pain.  Pt with decreased ability for full functional actiivities due to pain. Pt to benefit from skilled Pt to improve deficits and pain.     Clinical Presentation  Stable    Clinical Decision Making  Low    Rehab Potential  Good    PT Frequency  2x / week    PT Duration  6 weeks    PT Treatment/Interventions  ADLs/Self Care Home Management;Cryotherapy;Electrical Stimulation;Iontophoresis 4mg /ml Dexamethasone;Moist Heat;Therapeutic activities;Functional mobility training;Gait training;Ultrasound;Therapeutic exercise;Neuromuscular re-education;Patient/family education;Dry  needling;Passive range of motion;Manual techniques;Taping;Joint Manipulations    Consulted and Agree with Plan of Care  Patient       Patient will benefit from skilled therapeutic intervention in order to improve the following deficits and impairments:  Abnormal gait, Hypomobility, Decreased activity tolerance, Decreased strength, Pain, Decreased mobility, Increased muscle spasms, Improper body mechanics, Decreased range of motion, Impaired flexibility  Visit Diagnosis: Chronic left-sided low back pain without sciatica     Problem List Patient Active Problem List   Diagnosis Date Noted  . Back pain 02/03/2018  . Hypothyroidism 02/01/2014  . Chronic rhinitis 07/14/2008  . Asthma 07/14/2008    Sedalia Muta, PT, DPT 1:51 PM  02/23/18    [facility] [address] [place], [place], [address] Phone: [phone]   Fax:  [phone]  Name: [name] MRN: [id number] Date of Birth: 04-02-1991

## 2018-03-01 ENCOUNTER — Encounter: Payer: BLUE CROSS/BLUE SHIELD | Admitting: Physical Therapy

## 2018-03-03 ENCOUNTER — Encounter: Payer: Self-pay | Admitting: Physical Therapy

## 2018-03-03 ENCOUNTER — Ambulatory Visit: Payer: BLUE CROSS/BLUE SHIELD | Admitting: Physical Therapy

## 2018-03-03 DIAGNOSIS — G8929 Other chronic pain: Secondary | ICD-10-CM | POA: Diagnosis not present

## 2018-03-03 DIAGNOSIS — M545 Low back pain: Secondary | ICD-10-CM

## 2018-03-03 NOTE — Therapy (Signed)
[facility] [place] [address] [place], [place], [address] Phone: [phone]   Fax:  [phone]  Physical Therapy Treatment  Patient Details  Name: [name] MRN: [id number] Date of Birth: 01-Oct-1990 Referring Provider (PT): Gaspar Bidding   Encounter Date: 03/03/2018  PT End of Session - 03/03/18 0858    Visit Number  2    Number of Visits  12    Date for PT Re-Evaluation  04/06/18    Authorization Type  BCBS    PT Start Time  [phone]    PT Stop Time  0932    PT Time Calculation (min)  38 min    Activity Tolerance  Patient tolerated treatment well    Behavior During Therapy  [facility] for tasks assessed/performed       Past Medical History:  Diagnosis Date  . Back pain   . Chicken pox   . Laceration of head    short hospitalization due to this- from [facility]    Past Surgical History:  Procedure Laterality Date  . KNEE SURGERY Right 2008   meniscus  . WRIST SURGERY  2010   2 on wrist    There were no vitals filed for this visit.  Subjective Assessment - 03/03/18 0856    Subjective  Pt almost 10 min late to appt. Pt has been doing HEP. Has been able to do elliptical at gym. States minimal change in back pain.     Patient Stated Goals  Decreased pain     Currently in Pain?  Yes    Pain Score  1     Pain Location  Back    Pain Orientation  Left;Lower    Pain Descriptors / Indicators  Aching    Pain Type  Chronic pain    Pain Onset  More than a month ago    Pain Frequency  Intermittent                       [facility] Adult PT Treatment/Exercise - 03/03/18 0855      Posture/Postural Control   Posture Comments  --      Exercises   Exercises  Lumbar      Lumbar Exercises: Stretches   Active Hamstring Stretch  3 reps;30 seconds    Active Hamstring Stretch Limitations  seated    Single Knee to Chest Stretch  3 reps;30 seconds    Pelvic Tilt  20 reps    Other Lumbar Stretch Exercise  standing QL stretch 30 sec  x3 bil;       Lumbar Exercises: Supine   Clam  20 reps    Bent Knee Raise  20 reps    Bridge  20 reps      Manual Therapy   Manual Therapy  Soft tissue mobilization;Joint mobilization    Joint Mobilization  Lumbar PA mobs Gr 3    Soft tissue mobilization  DTM/IASTM to L/R lumbar paraspinals and L QL;                PT Short Term Goals - 02/23/18 1337      PT SHORT TERM GOAL #1   Title  Pt to be independent with initial HEP     Time  2    Period  Weeks    Status  New    Target Date  03/08/18      PT SHORT TERM GOAL #2   Title  Pt to report decreased  pain in lumbar region to 4/10 at worst/ with activity     Time  2    Period  Weeks    Status  New    Target Date  03/08/18        PT Long Term Goals - 02/23/18 1338      PT LONG TERM GOAL #1   Title  Pt to report decreased pain in lumbar region, to 0-2/10 with activity and work duties.     Time  6    Period  Weeks    Status  New    Target Date  04/05/18      PT LONG TERM GOAL #2   Title  Pt to demo improved lumbar ROM to be [facility], to improve posture and ability for bending, lifting.     Time  6    Period  Weeks    Status  New    Target Date  04/05/18      PT LONG TERM GOAL #3   Title  Pt to demo improved strength of core and hips to be at least 4+/5, with no postural changes seen with stability exercises.     Time  6    Period  Weeks    Status  New    Target Date  04/05/18            Plan - 03/03/18 0940    Clinical Impression Statement  Pt with soreness and tightness in L lumbar musculature > R. May benefit from dry needling for multifidus in future. Pt educated on Core contraction today, started with light stabilization. Plan to progress as tolerated.     Rehab Potential  Good    PT Frequency  2x / week    PT Duration  6 weeks    PT Treatment/Interventions  ADLs/Self Care Home Management;Cryotherapy;Electrical Stimulation;Iontophoresis 4mg /ml Dexamethasone;Moist Heat;Therapeutic  activities;Functional mobility training;Gait training;Ultrasound;Therapeutic exercise;Neuromuscular re-education;Patient/family education;Dry needling;Passive range of motion;Manual techniques;Taping;Joint Manipulations    Consulted and Agree with Plan of Care  Patient       Patient will benefit from skilled therapeutic intervention in order to improve the following deficits and impairments:  Abnormal gait, Hypomobility, Decreased activity tolerance, Decreased strength, Pain, Decreased mobility, Increased muscle spasms, Improper body mechanics, Decreased range of motion, Impaired flexibility  Visit Diagnosis: Chronic left-sided low back pain without sciatica     Problem List Patient Active Problem List   Diagnosis Date Noted  . Back pain 02/03/2018  . Hypothyroidism 02/01/2014  . Chronic rhinitis 07/14/2008  . Asthma 07/14/2008    Sedalia Muta, PT, DPT 9:45 AM  03/03/18    [facility] [address] [place], [place], [address] Phone: [phone]   Fax:  [phone]  Name: TYMEL CONELY MRN: [id number] Date of Birth: 1990-07-22

## 2018-03-04 ENCOUNTER — Ambulatory Visit: Payer: BLUE CROSS/BLUE SHIELD | Admitting: Physical Therapy

## 2018-03-04 DIAGNOSIS — M545 Low back pain, unspecified: Secondary | ICD-10-CM

## 2018-03-04 DIAGNOSIS — G8929 Other chronic pain: Secondary | ICD-10-CM

## 2018-03-07 ENCOUNTER — Encounter: Payer: Self-pay | Admitting: Physical Therapy

## 2018-03-07 NOTE — Therapy (Signed)
[facility] [place] [facility] [address] [place], [place], [address] Phone: [phone]   Fax:  [phone]  Physical Therapy Treatment  Patient Details  Name: [name] MRN: [id number] Date of Birth: 24-Jul-1990 Referring Provider (PT): Gaspar Bidding   Encounter Date: 03/04/2018  PT End of Session - 03/07/18 1540    Visit Number  3    Number of Visits  12    Date for PT Re-Evaluation  04/06/18    Authorization Type  BCBS    PT Start Time  1515    PT Stop Time  1558    PT Time Calculation (min)  43 min    Activity Tolerance  Patient tolerated treatment well    Behavior During Therapy  [facility] for tasks assessed/performed       Past Medical History:  Diagnosis Date  . Back pain   . Chicken pox   . Laceration of head    short hospitalization due to this- from [facility]    Past Surgical History:  Procedure Laterality Date  . KNEE SURGERY Right 2008   meniscus  . WRIST SURGERY  2010   2 on wrist    There were no vitals filed for this visit.  Subjective Assessment - 03/07/18 1539    Subjective  Pt states increased paim since yesterday.     Currently in Pain?  Yes    Pain Score  3     Pain Orientation  Left;Lower    Pain Descriptors / Indicators  Aching    Pain Type  Chronic pain    Pain Onset  More than a month ago    Pain Frequency  Intermittent                       [facility] Adult PT Treatment/Exercise - 03/07/18 0001      Exercises   Exercises  Lumbar      Lumbar Exercises: Stretches   Active Hamstring Stretch  3 reps;30 seconds    Active Hamstring Stretch Limitations  seated    Single Knee to Chest Stretch  3 reps;30 seconds      Lumbar Exercises: Supine   Bent Knee Raise  20 reps    Bridge  20 reps    Straight Leg Raise  20 reps    Straight Leg Raises Limitations  with TA, Bil;       Manual Therapy   Manual Therapy  Soft tissue mobilization;Joint mobilization    Joint Mobilization  Lumbar PA mobs Gr 3     Soft tissue mobilization  DTM/IASTM to L/R lumbar paraspinals and L QL;                PT Short Term Goals - 02/23/18 1337      PT SHORT TERM GOAL #1   Title  Pt to be independent with initial HEP     Time  2    Period  Weeks    Status  New    Target Date  03/08/18      PT SHORT TERM GOAL #2   Title  Pt to report decreased pain in lumbar region to 4/10 at worst/ with activity     Time  2    Period  Weeks    Status  New    Target Date  03/08/18        PT Long Term Goals - 02/23/18 1338      PT LONG TERM GOAL #1  Title  Pt to report decreased pain in lumbar region, to 0-2/10 with activity and work duties.     Time  6    Period  Weeks    Status  New    Target Date  04/05/18      PT LONG TERM GOAL #2   Title  Pt to demo improved lumbar ROM to be [facility], to improve posture and ability for bending, lifting.     Time  6    Period  Weeks    Status  New    Target Date  04/05/18      PT LONG TERM GOAL #3   Title  Pt to demo improved strength of core and hips to be at least 4+/5, with no postural changes seen with stability exercises.     Time  6    Period  Weeks    Status  New    Target Date  04/05/18            Plan - 03/07/18 1543    Clinical Impression Statement  Increased soreness in L QL , addressed with manual therapy and IASTM. Pt with noted weakness in core, continued core strengthening and ther ex. Will progress as pt able.     Rehab Potential  Good    PT Frequency  2x / week    PT Duration  6 weeks    PT Treatment/Interventions  ADLs/Self Care Home Management;Cryotherapy;Electrical Stimulation;Iontophoresis 4mg /ml Dexamethasone;Moist Heat;Therapeutic activities;Functional mobility training;Gait training;Ultrasound;Therapeutic exercise;Neuromuscular re-education;Patient/family education;Dry needling;Passive range of motion;Manual techniques;Taping;Joint Manipulations    Consulted and Agree with Plan of Care  Patient       Patient will benefit  from skilled therapeutic intervention in order to improve the following deficits and impairments:  Abnormal gait, Hypomobility, Decreased activity tolerance, Decreased strength, Pain, Decreased mobility, Increased muscle spasms, Improper body mechanics, Decreased range of motion, Impaired flexibility  Visit Diagnosis: Chronic left-sided low back pain without sciatica     Problem List Patient Active Problem List   Diagnosis Date Noted  . Back pain 02/03/2018  . Hypothyroidism 02/01/2014  . Chronic rhinitis 07/14/2008  . Asthma 07/14/2008    Sedalia Muta, PT, DPT 3:45 PM  03/07/18    [facility] [place] [address] [place], [place], [address] Phone: [phone]   Fax:  [phone]  Name: DEUNDRE THONG MRN: [id number] Date of Birth: 11-13-1990

## 2018-03-09 ENCOUNTER — Encounter: Payer: BLUE CROSS/BLUE SHIELD | Admitting: Physical Therapy

## 2018-03-12 ENCOUNTER — Ambulatory Visit: Payer: BLUE CROSS/BLUE SHIELD | Admitting: Physical Therapy

## 2018-03-12 ENCOUNTER — Encounter: Payer: Self-pay | Admitting: Physical Therapy

## 2018-03-12 DIAGNOSIS — M545 Low back pain: Secondary | ICD-10-CM | POA: Diagnosis not present

## 2018-03-12 DIAGNOSIS — G8929 Other chronic pain: Secondary | ICD-10-CM | POA: Diagnosis not present

## 2018-03-12 NOTE — Therapy (Signed)
[facility] [address] [place], [place], [address] Phone: [phone]   Fax:  [phone]  Physical Therapy Treatment  Patient Details  Name: [name] MRN: [id number] Date of Birth: December 21, 1990 Referring Provider (PT): Teresa Coombs   Encounter Date: 03/12/2018  PT End of Session - 03/12/18 0924    Visit Number  4    Number of Visits  12    Date for PT Re-Evaluation  04/06/18    Authorization Type  BCBS    PT Start Time  0850    PT Stop Time  0928    PT Time Calculation (min)  38 min    Activity Tolerance  Patient tolerated treatment well    Behavior During Therapy  [facility] for tasks assessed/performed       Past Medical History:  Diagnosis Date  . Back pain   . Chicken pox   . Laceration of head    short hospitalization due to this- from [facility]    Past Surgical History:  Procedure Laterality Date  . KNEE SURGERY Right 2008   meniscus  . WRIST SURGERY  2010   2 on wrist    There were no vitals filed for this visit.  Subjective Assessment - 03/12/18 0936    Subjective  Pt states minimal pain for a few days, following last visit, then had increased pain yesterday, when riding in low car.     Currently in Pain?  Yes    Pain Score  4     Pain Location  Back    Pain Orientation  Left;Lower    Pain Descriptors / Indicators  Aching;Tightness    Pain Type  Chronic pain    Pain Onset  More than a month ago    Pain Frequency  Intermittent                       [facility] Adult PT Treatment/Exercise - 03/12/18 0909      Exercises   Exercises  Lumbar      Lumbar Exercises: Stretches   Active Hamstring Stretch  3 reps;30 seconds    Active Hamstring Stretch Limitations  seated    Single Knee to Chest Stretch  --    Other Lumbar Stretch Exercise  standing QL stretch 30 sec x3 bil;     Other Lumbar Stretch Exercise  Childs pose L/ R / Center 30 sec x2 each;       Lumbar Exercises: Supine   Bent Knee Raise   20 reps    Bridge  20 reps    Straight Leg Raise  20 reps    Straight Leg Raises Limitations  with TA, Bil;       Manual Therapy   Manual Therapy  Soft tissue mobilization;Joint mobilization    Joint Mobilization  Lumbar PA mobs Gr 3    Soft tissue mobilization  DTM/IASTM to L/R lumbar paraspinals and L QL;                PT Short Term Goals - 03/12/18 0935      PT SHORT TERM GOAL #1   Title  Pt to be independent with initial HEP     Time  2    Period  Weeks    Status  Achieved      PT SHORT TERM GOAL #2   Title  Pt to report decreased pain in lumbar region to 4/10 at worst/ with activity  Time  2    Period  Weeks    Status  Partially Met        PT Long Term Goals - 02/23/18 1338      PT LONG TERM GOAL #1   Title  Pt to report decreased pain in lumbar region, to 0-2/10 with activity and work duties.     Time  6    Period  Weeks    Status  New    Target Date  04/05/18      PT LONG TERM GOAL #2   Title  Pt to demo improved lumbar ROM to be [facility], to improve posture and ability for bending, lifting.     Time  6    Period  Weeks    Status  New    Target Date  04/05/18      PT LONG TERM GOAL #3   Title  Pt to demo improved strength of core and hips to be at least 4+/5, with no postural changes seen with stability exercises.     Time  6    Period  Weeks    Status  New    Target Date  04/05/18            Plan - 03/12/18 9795    Clinical Impression Statement  Discussed neutral pelvis posture in sitting, when sitting in lower car. Pt continues to have tightness on L vs R paraspinal and QL region. Pt educated on hip hinge with sitting and squat movement today. Recommend continued care.     Rehab Potential  Good    PT Frequency  2x / week    PT Duration  6 weeks    PT Treatment/Interventions  ADLs/Self Care Home Management;Cryotherapy;Electrical Stimulation;Iontophoresis 36m/ml Dexamethasone;Moist Heat;Therapeutic activities;Functional mobility  training;Gait training;Ultrasound;Therapeutic exercise;Neuromuscular re-education;Patient/family education;Dry needling;Passive range of motion;Manual techniques;Taping;Joint Manipulations    Consulted and Agree with Plan of Care  Patient       Patient will benefit from skilled therapeutic intervention in order to improve the following deficits and impairments:  Abnormal gait, Hypomobility, Decreased activity tolerance, Decreased strength, Pain, Decreased mobility, Increased muscle spasms, Improper body mechanics, Decreased range of motion, Impaired flexibility  Visit Diagnosis: Chronic left-sided low back pain without sciatica     Problem List Patient Active Problem List   Diagnosis Date Noted  . Back pain 02/03/2018  . Hypothyroidism 02/01/2014  . Chronic rhinitis 07/14/2008  . Asthma 07/14/2008   LLyndee Hensen PT, DPT 9:41 AM  03/12/18    [facility] [place] [address]Phone: [phone]  Fax:  [phone] Name: JSULTAN PARGASMRN: [id number] of Birth: [birth year]

## 2018-03-16 ENCOUNTER — Encounter: Payer: Self-pay | Admitting: Physical Therapy

## 2018-03-16 ENCOUNTER — Ambulatory Visit: Payer: BLUE CROSS/BLUE SHIELD | Admitting: Physical Therapy

## 2018-03-16 DIAGNOSIS — M545 Low back pain, unspecified: Secondary | ICD-10-CM

## 2018-03-16 DIAGNOSIS — G8929 Other chronic pain: Secondary | ICD-10-CM

## 2018-03-16 NOTE — Therapy (Signed)
[facility] [address] [place], [place], [address] Phone: [phone]   Fax:  [phone]  Physical Therapy Treatment  Patient Details  Name: [name] MRN: [id number] Date of Birth: 1990-05-25 Referring Provider (PT): Teresa Coombs   Encounter Date: 03/16/2018  PT End of Session - 03/16/18 0926    Visit Number  5    Number of Visits  12    Date for PT Re-Evaluation  04/06/18    Authorization Type  BCBS    PT Start Time  [phone]    PT Stop Time  0932    PT Time Calculation (min)  39 min    Activity Tolerance  Patient tolerated treatment well    Behavior During Therapy  [facility] for tasks assessed/performed       Past Medical History:  Diagnosis Date  . Back pain   . Chicken pox   . Laceration of head    short hospitalization due to this- from [facility]    Past Surgical History:  Procedure Laterality Date  . KNEE SURGERY Right 2008   meniscus  . WRIST SURGERY  2010   2 on wrist    There were no vitals filed for this visit.  Subjective Assessment - 03/16/18 1446    Subjective  Pt states pain less frequently, but still has bothersome pain on L, today down a bit lower than usual.     Patient Stated Goals  Decreased pain     Currently in Pain?  Yes    Pain Score  4     Pain Location  Back    Pain Orientation  Left;Lower    Pain Descriptors / Indicators  Aching;Tightness    Pain Type  Chronic pain    Pain Onset  More than a month ago    Pain Frequency  Intermittent    Aggravating Factors   Sitting in low care, bending                       [facility] Adult PT Treatment/Exercise - 03/16/18 0855      Exercises   Exercises  Lumbar      Lumbar Exercises: Stretches   Active Hamstring Stretch  3 reps;30 seconds    Active Hamstring Stretch Limitations  seated    Single Knee to Chest Stretch  3 reps;30 seconds    Lower Trunk Rotation  5 reps;10 seconds    Other Lumbar Stretch Exercise  standing QL stretch 30  sec x3 bil;     Other Lumbar Stretch Exercise  Childs pose Center 30 sec x2 each;       Lumbar Exercises: Standing   Other Standing Lumbar Exercises  mini squats, with hip hinge x15;       Lumbar Exercises: Seated   Other Seated Lumbar Exercises  Hip hinge with stick at back for posture x15;       Lumbar Exercises: Supine   Bent Knee Raise  20 reps    Bridge  20 reps    Straight Leg Raise  20 reps    Straight Leg Raises Limitations  with TA, Bil;       Lumbar Exercises: Quadruped   Opposite Arm/Leg Raise  20 reps    Opposite Arm/Leg Raise Limitations  LE only      Manual Therapy   Manual Therapy  Soft tissue mobilization;Joint mobilization    Manual therapy comments  Skilled palpation and monitoring of soft tissue during dry  needling;     Joint Mobilization  --    Soft tissue mobilization  DTM to L lumbar region;        Trigger Point Dry Needling - 03/16/18 1440    Consent Given?  Yes    Education Handout Provided  Yes    Muscles Treated Lower Body  --   lumbar multifidus 2 on L and R;           PT Education - 03/16/18 1447    Education Details  Pt education on dry needling.     Person(s) Educated  Patient    Methods  Explanation    Comprehension  Verbalized understanding       PT Short Term Goals - 03/12/18 0935      PT SHORT TERM GOAL #1   Title  Pt to be independent with initial HEP     Time  2    Period  Weeks    Status  Achieved      PT SHORT TERM GOAL #2   Title  Pt to report decreased pain in lumbar region to 4/10 at worst/ with activity     Time  2    Period  Weeks    Status  Partially Met        PT Long Term Goals - 02/23/18 1338      PT LONG TERM GOAL #1   Title  Pt to report decreased pain in lumbar region, to 0-2/10 with activity and work duties.     Time  6    Period  Weeks    Status  New    Target Date  04/05/18      PT LONG TERM GOAL #2   Title  Pt to demo improved lumbar ROM to be [facility], to improve posture and ability for  bending, lifting.     Time  6    Period  Weeks    Status  New    Target Date  04/05/18      PT LONG TERM GOAL #3   Title  Pt to demo improved strength of core and hips to be at least 4+/5, with no postural changes seen with stability exercises.     Time  6    Period  Weeks    Status  New    Target Date  04/05/18            Plan - 03/16/18 1448    Clinical Impression Statement  Pt with increased tightness and soreness on L with dry needling and manual today. Pt improving with lumbar mobility, as well as ability for ther ex. Pt to benefit from continued manual and strengthening.     Rehab Potential  Good    PT Frequency  2x / week    PT Duration  6 weeks    PT Treatment/Interventions  ADLs/Self Care Home Management;Cryotherapy;Electrical Stimulation;Iontophoresis 67m/ml Dexamethasone;Moist Heat;Therapeutic activities;Functional mobility training;Gait training;Ultrasound;Therapeutic exercise;Neuromuscular re-education;Patient/family education;Dry needling;Passive range of motion;Manual techniques;Taping;Joint Manipulations    Consulted and Agree with Plan of Care  Patient       Patient will benefit from skilled therapeutic intervention in order to improve the following deficits and impairments:  Abnormal gait, Hypomobility, Decreased activity tolerance, Decreased strength, Pain, Decreased mobility, Increased muscle spasms, Improper body mechanics, Decreased range of motion, Impaired flexibility  Visit Diagnosis: Chronic left-sided low back pain without sciatica     Problem List Patient Active Problem List   Diagnosis Date Noted  . Back pain  02/03/2018  . Hypothyroidism 02/01/2014  . Chronic rhinitis 07/14/2008  . Asthma 07/14/2008    Lyndee Hensen, PT, DPT 2:49 PM  03/16/18    [place], [place], [address] Phone: [phone]   Fax:  [phone]  Name: [name] MRN: [id number] Date of  Birth: 03-30-1991

## 2018-03-18 ENCOUNTER — Encounter: Payer: Self-pay | Admitting: Physical Therapy

## 2018-03-18 ENCOUNTER — Ambulatory Visit: Payer: BLUE CROSS/BLUE SHIELD | Admitting: Physical Therapy

## 2018-03-18 DIAGNOSIS — M545 Low back pain, unspecified: Secondary | ICD-10-CM

## 2018-03-18 DIAGNOSIS — G8929 Other chronic pain: Secondary | ICD-10-CM

## 2018-03-18 NOTE — Therapy (Signed)
[facility] [address] [place], [place], [address] Phone: [phone]   Fax:  [phone]  Physical Therapy Treatment  Patient Details  Name: [name] MRN: [id number] Date of Birth: [birth year] Referring Provider (PT): Teresa Coombs   Encounter Date: 03/18/2018  PT End of Session - 03/18/18 0913    Visit Number  6    Number of Visits  12    Date for PT Re-Evaluation  04/06/18    Authorization Type  BCBS    PT Start Time  [phone]    PT Stop Time  0930    PT Time Calculation (min)  35 min    Activity Tolerance  Patient tolerated treatment well    Behavior During Therapy  [facility] for tasks assessed/performed       Past Medical History:  Diagnosis Date  . Back pain   . Chicken pox   . Laceration of head    short hospitalization due to this- from [facility]    Past Surgical History:  Procedure Laterality Date  . KNEE SURGERY Right 2008   meniscus  . WRIST SURGERY  2010   2 on wrist    There were no vitals filed for this visit.  Subjective Assessment - 03/18/18 0912    Subjective  Pt states minimal pain in back today or last few days. He is going to [place] for training for a week, leaving today.     Patient Stated Goals  Decreased pain     Currently in Pain?  Yes    Pain Score  2     Pain Location  Back    Pain Orientation  Left;Lower    Pain Descriptors / Indicators  Aching;Tightness    Pain Type  Chronic pain    Pain Onset  More than a month ago    Pain Frequency  Intermittent                       [facility] Adult PT Treatment/Exercise - 03/18/18 0916      Exercises   Exercises  Lumbar      Lumbar Exercises: Stretches   Active Hamstring Stretch  --    Active Hamstring Stretch Limitations  --    Single Knee to Chest Stretch  --    Lower Trunk Rotation  5 reps;10 seconds    Other Lumbar Stretch Exercise  --    Other Lumbar Stretch Exercise  Childs pose Center 30 sec x2 L/R/C  each;       Lumbar  Exercises: Standing   Other Standing Lumbar Exercises  --    Other Standing Lumbar Exercises  HIp abd with RTB x20 bil;       Lumbar Exercises: Seated   Other Seated Lumbar Exercises  --      Lumbar Exercises: Supine   Bent Knee Raise  --    Bridge  --    Bridge with [employer]  20 reps    Straight Leg Raise  20 reps    Straight Leg Raises Limitations  with TA, Bil;     Large Ball Oblique Isometric Limitations  90/90 heel taps;       Lumbar Exercises: Quadruped   Opposite Arm/Leg Raise  20 reps    Opposite Arm/Leg Raise Limitations  LE only      Manual Therapy   Manual Therapy  Soft tissue mobilization;Joint mobilization    Manual therapy comments  Skilled palpation and monitoring of  soft tissue during dry needling;     Joint Mobilization  Lumbar PA mobs Gr 3    Soft tissue mobilization  DTM to L lumbar region;        Trigger Point Dry Needling - 03/18/18 0931    Consent Given?  Yes    Muscles Treated Lower Body  --   Lumbar mulifidus - L; Improved muscle length           PT Education - 03/18/18 0912    Education Details  Reviewed HEP.        PT Short Term Goals - 03/12/18 0935      PT SHORT TERM GOAL #1   Title  Pt to be independent with initial HEP     Time  2    Period  Weeks    Status  Achieved      PT SHORT TERM GOAL #2   Title  Pt to report decreased pain in lumbar region to 4/10 at worst/ with activity     Time  2    Period  Weeks    Status  Partially Met        PT Long Term Goals - 02/23/18 1338      PT LONG TERM GOAL #1   Title  Pt to report decreased pain in lumbar region, to 0-2/10 with activity and work duties.     Time  6    Period  Weeks    Status  New    Target Date  04/05/18      PT LONG TERM GOAL #2   Title  Pt to demo improved lumbar ROM to be [facility], to improve posture and ability for bending, lifting.     Time  6    Period  Weeks    Status  New    Target Date  04/05/18      PT LONG TERM GOAL #3   Title  Pt to demo  improved strength of core and hips to be at least 4+/5, with no postural changes seen with stability exercises.     Time  6    Period  Weeks    Status  New    Target Date  04/05/18            Plan - 03/18/18 0914    Clinical Impression Statement  Pt 10 min late to appt today. He has decreasing pain and tightness on L side of lumbar region, Improving with strength and stability with ther ex. Pt to benefit from progression of strengthening, recommended pt return after he gets back from his trip.     Rehab Potential  Good    PT Frequency  2x / week    PT Duration  6 weeks    PT Treatment/Interventions  ADLs/Self Care Home Management;Cryotherapy;Electrical Stimulation;Iontophoresis 93m/ml Dexamethasone;Moist Heat;Therapeutic activities;Functional mobility training;Gait training;Ultrasound;Therapeutic exercise;Neuromuscular re-education;Patient/family education;Dry needling;Passive range of motion;Manual techniques;Taping;Joint Manipulations    Consulted and Agree with Plan of Care  Patient       Patient will benefit from skilled therapeutic intervention in order to improve the following deficits and impairments:  Abnormal gait, Hypomobility, Decreased activity tolerance, Decreased strength, Pain, Decreased mobility, Increased muscle spasms, Improper body mechanics, Decreased range of motion, Impaired flexibility  Visit Diagnosis: Chronic left-sided low back pain without sciatica     Problem List Patient Active Problem List   Diagnosis Date Noted  . Back pain 02/03/2018  . Hypothyroidism 02/01/2014  . Chronic rhinitis 07/14/2008  . Asthma  07/14/2008    Lyndee Hensen, PT, DPT 9:32 AM  03/18/18    [facility] [place], [place], [address] Phone: [phone]   Fax:  [phone]  Name: [name] MRN: [id number] Date of Birth: [birth year]

## 2018-03-29 ENCOUNTER — Encounter: Payer: BLUE CROSS/BLUE SHIELD | Admitting: Physical Therapy

## 2018-03-30 ENCOUNTER — Ambulatory Visit: Payer: Self-pay | Admitting: Family Medicine

## 2018-03-31 ENCOUNTER — Ambulatory Visit: Payer: BLUE CROSS/BLUE SHIELD | Admitting: Physical Therapy

## 2018-03-31 ENCOUNTER — Encounter: Payer: Self-pay | Admitting: Physical Therapy

## 2018-03-31 ENCOUNTER — Other Ambulatory Visit (INDEPENDENT_AMBULATORY_CARE_PROVIDER_SITE_OTHER): Payer: BLUE CROSS/BLUE SHIELD

## 2018-03-31 DIAGNOSIS — M545 Low back pain, unspecified: Secondary | ICD-10-CM

## 2018-03-31 DIAGNOSIS — G8929 Other chronic pain: Secondary | ICD-10-CM

## 2018-03-31 DIAGNOSIS — E038 Other specified hypothyroidism: Secondary | ICD-10-CM | POA: Diagnosis not present

## 2018-03-31 LAB — TSH: TSH: 8.16 u[IU]/mL — ABNORMAL HIGH (ref 0.35–4.50)

## 2018-03-31 NOTE — Therapy (Signed)
[facility] [address] [place], [place], [address] Phone: [phone]   Fax:  [phone]  Physical Therapy Treatment  Patient Details  Name: [name] MRN: [id number] Date of Birth: Oct 18, 1990 Referring Provider (PT): Teresa Coombs   Encounter Date: 03/31/2018  PT End of Session - 03/31/18 0959    Visit Number  7    Number of Visits  12    Date for PT Re-Evaluation  04/06/18    Authorization Type  BCBS    PT Start Time  0850    PT Stop Time  0935    PT Time Calculation (min)  45 min    Activity Tolerance  Patient tolerated treatment well    Behavior During Therapy  [facility] for tasks assessed/performed       Past Medical History:  Diagnosis Date  . Back pain   . Chicken pox   . Laceration of head    short hospitalization due to this- from [facility]    Past Surgical History:  Procedure Laterality Date  . KNEE SURGERY Right 2008   meniscus  . WRIST SURGERY  2010   2 on wrist    There were no vitals filed for this visit.  Subjective Assessment - 03/31/18 0958    Subjective  Pt has been out of town at [employer], did a lot of physical activity. Back was "pretty good" did have one day of pain, but pt feels is improving.     Patient Stated Goals  Decreased pain     Currently in Pain?  Yes    Pain Score  2     Pain Location  Back    Pain Orientation  Left;Lower;Mid    Pain Descriptors / Indicators  Aching;Tightness    Pain Type  Chronic pain    Pain Onset  More than a month ago    Pain Frequency  Intermittent                       [facility] Adult PT Treatment/Exercise - 03/31/18 0851      Exercises   Exercises  Lumbar      Lumbar Exercises: Stretches   Active Hamstring Stretch  3 reps;30 seconds    Lower Trunk Rotation  --    Other Lumbar Stretch Exercise  Childs pose Center 30 sec x2 L/R/C  each;       Lumbar Exercises: Aerobic   Stationary Bike  L2 x 8 min      Lumbar Exercises: Standing   Functional Squats  20 reps    Other Standing Lumbar Exercises  --      Lumbar Exercises: Supine   Bridge  20 reps    Bridge with [employer]  --    Straight Leg Raise  20 reps    Straight Leg Raises Limitations  with TA, Bil;     Large Ball Oblique Isometric Limitations  90/90 heel taps;     Other Supine Lumbar Exercises  Bicycle 2x10;       Lumbar Exercises: Quadruped   Opposite Arm/Leg Raise  20 reps    Opposite Arm/Leg Raise Limitations  x20 LE only,  x20 UE/LE      Manual Therapy   Manual Therapy  Soft tissue mobilization;Joint mobilization    Manual therapy comments  --    Joint Mobilization  Lumbar PA mobs Gr 3    Soft tissue mobilization  DTM and IASTM to L thoracic and  lumbar region;                PT Short Term Goals - 03/12/18 0935      PT SHORT TERM GOAL #1   Title  Pt to be independent with initial HEP     Time  2    Period  Weeks    Status  Achieved      PT SHORT TERM GOAL #2   Title  Pt to report decreased pain in lumbar region to 4/10 at worst/ with activity     Time  2    Period  Weeks    Status  Partially Met        PT Long Term Goals - 02/23/18 1338      PT LONG TERM GOAL #1   Title  Pt to report decreased pain in lumbar region, to 0-2/10 with activity and work duties.     Time  6    Period  Weeks    Status  New    Target Date  04/05/18      PT LONG TERM GOAL #2   Title  Pt to demo improved lumbar ROM to be [facility], to improve posture and ability for bending, lifting.     Time  6    Period  Weeks    Status  New    Target Date  04/05/18      PT LONG TERM GOAL #3   Title  Pt to demo improved strength of core and hips to be at least 4+/5, with no postural changes seen with stability exercises.     Time  6    Period  Weeks    Status  New    Target Date  04/05/18            Plan - 03/31/18 1000    Clinical Impression Statement  Pt with improving ability for core stabilization with ther ex ,able to progress difficulty of ther ex  today. Pt with pain in back only with deep palpation of L low thoracic paraspinal and multifidus. DTM and IASTM done to improve. Pt progressing well, with improving mobility and pain. Plan to re-assess for d/c or continued care at next visit.     Rehab Potential  Good    PT Frequency  2x / week    PT Duration  6 weeks    PT Treatment/Interventions  ADLs/Self Care Home Management;Cryotherapy;Electrical Stimulation;Iontophoresis 52m/ml Dexamethasone;Moist Heat;Therapeutic activities;Functional mobility training;Gait training;Ultrasound;Therapeutic exercise;Neuromuscular re-education;Patient/family education;Dry needling;Passive range of motion;Manual techniques;Taping;Joint Manipulations    Consulted and Agree with Plan of Care  Patient       Patient will benefit from skilled therapeutic intervention in order to improve the following deficits and impairments:  Abnormal gait, Hypomobility, Decreased activity tolerance, Decreased strength, Pain, Decreased mobility, Increased muscle spasms, Improper body mechanics, Decreased range of motion, Impaired flexibility  Visit Diagnosis: Chronic left-sided low back pain without sciatica     Problem List Patient Active Problem List   Diagnosis Date Noted  . Back pain 02/03/2018  . Hypothyroidism 02/01/2014  . Chronic rhinitis 07/14/2008  . Asthma 07/14/2008    LLyndee Hensen PT, DPT 10:03 AM  03/31/18    [facility] [place] [address]Phone: [phone]  Fax:  [phone] Name: JIZSAK MEIRMRN: [id number] of Birth: 910-16-1992

## 2018-04-06 ENCOUNTER — Encounter: Payer: BLUE CROSS/BLUE SHIELD | Admitting: Physical Therapy

## 2018-04-07 ENCOUNTER — Other Ambulatory Visit: Payer: Self-pay

## 2018-04-07 DIAGNOSIS — E039 Hypothyroidism, unspecified: Secondary | ICD-10-CM

## 2018-04-07 MED ORDER — LEVOTHYROXINE SODIUM 75 MCG PO TABS: 75.0000 ug | ORAL_TABLET | Freq: Every day | ORAL | 3 refills | Status: DC

## 2018-04-19 ENCOUNTER — Encounter: Payer: Self-pay | Admitting: Physical Therapy

## 2018-04-19 ENCOUNTER — Ambulatory Visit: Payer: BLUE CROSS/BLUE SHIELD | Admitting: Physical Therapy

## 2018-04-19 DIAGNOSIS — G8929 Other chronic pain: Secondary | ICD-10-CM

## 2018-04-19 DIAGNOSIS — M545 Low back pain, unspecified: Secondary | ICD-10-CM

## 2018-04-19 NOTE — Therapy (Signed)
[facility] [address] [place], [place], [address] Phone: [phone]   Fax:  [phone]  Physical Therapy Treatment  Patient Details  Name: [name] MRN: [id number] Date of Birth: November 09, 1990 Referring Provider (PT): Teresa Coombs   Encounter Date: 04/19/2018  PT End of Session - 04/19/18 0857    Visit Number  8    Number of Visits  20    Date for PT Re-Evaluation  05/17/18    Authorization Type  BCBS    PT Start Time  0848    PT Stop Time  0930    PT Time Calculation (min)  42 min    Activity Tolerance  Patient tolerated treatment well    Behavior During Therapy  [facility] for tasks assessed/performed       Past Medical History:  Diagnosis Date  . Back pain   . Chicken pox   . Laceration of head    short hospitalization due to this- from [facility]    Past Surgical History:  Procedure Laterality Date  . KNEE SURGERY Right 2008   meniscus  . WRIST SURGERY  2010   2 on wrist    There were no vitals filed for this visit.  Subjective Assessment - 04/19/18 0849    Subjective  Pt last seen 11/27. He states previous pain relief, but has increased pain in the last few days. He has been wearing different work uniform, with different belt/equipment.     Patient Stated Goals  Decreased pain     Currently in Pain?  Yes    Pain Score  3     Pain Orientation  Left;Lower    Pain Descriptors / Indicators  Aching;Tightness    Pain Type  Chronic pain    Pain Onset  More than a month ago    Pain Frequency  Intermittent    Aggravating Factors   sitting, standing, working     Pain Relieving Factors  none stated         [facility] PT Assessment - 04/19/18 0001      AROM   Overall AROM Comments  Much improved lumbar ROM: WFL       Strength   Overall Strength Comments  Hips: 4+/5;  Core: 4-/5       Palpation   Palpation comment  mild pain in L QL                   [facility] Adult PT Treatment/Exercise - 04/19/18 0900       Exercises   Exercises  Lumbar      Lumbar Exercises: Stretches   Active Hamstring Stretch  3 reps;30 seconds    Single Knee to Chest Stretch  3 reps;30 seconds    Other Lumbar Stretch Exercise  --      Lumbar Exercises: Aerobic   Stationary Bike  L2 x 8 min      Lumbar Exercises: Standing   Functional Squats  --    Other Standing Lumbar Exercises  Hip extension 2x10 bil;       Lumbar Exercises: Supine   Bridge  20 reps    Straight Leg Raise  20 reps    Straight Leg Raises Limitations  with TA, Bil;     Large Ball Oblique Isometric Limitations  90/90 heel taps;     Other Supine Lumbar Exercises  Bicycle 2x10;       Lumbar Exercises: Sidelying   Hip Abduction  20 reps;Both  Lumbar Exercises: Quadruped   Opposite Arm/Leg Raise  --    Opposite Arm/Leg Raise Limitations  --    Plank  30 sec x3      Manual Therapy   Manual Therapy  Soft tissue mobilization;Joint mobilization    Joint Mobilization  --    Soft tissue mobilization  --             PT Education - 04/19/18 0856    Education Details  PT POC, HEP reviewed    Person(s) Educated  Patient    Methods  Explanation    Comprehension  Verbalized understanding       PT Short Term Goals - 04/19/18 0859      PT SHORT TERM GOAL #1   Title  Pt to be independent with initial HEP     Time  2    Period  Weeks    Status  Achieved      PT SHORT TERM GOAL #2   Title  Pt to report decreased pain in lumbar region to 4/10 at worst/ with activity     Time  2    Period  Weeks    Status  Achieved        PT Long Term Goals - 04/19/18 0859      PT LONG TERM GOAL #1   Title  Pt to report decreased pain in lumbar region, to 0-2/10 with activity and work duties.     Time  6    Period  Weeks    Status  Partially Met      PT LONG TERM GOAL #2   Title  Pt to demo improved lumbar ROM to be [facility], to improve posture and ability for bending, lifting.     Time  6    Period  Weeks    Status  Partially Met       PT LONG TERM GOAL #3   Title  Pt to demo improved strength of core and hips to be at least 4+/5, with no postural changes seen with stability exercises.     Time  6    Period  Weeks    Status  On-going            Plan - 04/19/18 1121    Clinical Impression Statement  Pt with much improved mobility since evaluation, with improved HS length and lumbar ROM. He has been able to progress ther ex, but continues to demonstrate core weakness, and will benefit from continued practice and education on this. He has soreness at L QL today with deep palpation. Pt making improvements, but still having pain with daily activities and work duties. Pt to benefit from continued care, to continue strengthening, finalize HEP, and to decrease pain/tightness in L QL. Discussed consistency with appointments, pt is police officer, and has difficulty with scheduling at times.     Rehab Potential  Good    PT Frequency  2x / week    PT Duration  4 weeks    PT Treatment/Interventions  ADLs/Self Care Home Management;Cryotherapy;Electrical Stimulation;Iontophoresis 27m/ml Dexamethasone;Moist Heat;Therapeutic activities;Functional mobility training;Gait training;Ultrasound;Therapeutic exercise;Neuromuscular re-education;Patient/family education;Dry needling;Passive range of motion;Manual techniques;Taping;Joint Manipulations    Consulted and Agree with Plan of Care  Patient       Patient will benefit from skilled therapeutic intervention in order to improve the following deficits and impairments:  Abnormal gait, Hypomobility, Decreased activity tolerance, Decreased strength, Pain, Decreased mobility, Increased muscle spasms, Improper body mechanics, Decreased range of motion, Impaired  flexibility  Visit Diagnosis: Chronic left-sided low back pain without sciatica     Problem List Patient Active Problem List   Diagnosis Date Noted  . Back pain 02/03/2018  . Hypothyroidism 02/01/2014  . Chronic rhinitis 07/14/2008   . Asthma 07/14/2008    Lyndee Hensen, PT, DPT 11:44 AM  04/19/18    [facility] [place], [place], [address] Phone: [phone]   Fax:  [phone]  Name: JULIES CARMICKLE MRN: [id number] Date of Birth: 1990/08/10

## 2018-04-22 ENCOUNTER — Encounter: Payer: Self-pay | Admitting: Physical Therapy

## 2018-04-22 ENCOUNTER — Ambulatory Visit: Payer: BLUE CROSS/BLUE SHIELD | Admitting: Physical Therapy

## 2018-04-22 DIAGNOSIS — G8929 Other chronic pain: Secondary | ICD-10-CM

## 2018-04-22 DIAGNOSIS — M545 Low back pain: Secondary | ICD-10-CM | POA: Diagnosis not present

## 2018-04-22 NOTE — Therapy (Addendum)
[facility] [address] [place], [place], [address] Phone: [phone]   Fax:  [phone]  Physical Therapy Treatment  Patient Details  Name: [name] MRN: [id number] Date of Birth: 08/02/1990 Referring Provider (PT): Teresa Coombs   Encounter Date: 04/22/2018  PT End of Session - 04/22/18 0942    Visit Number  9    Number of Visits  20    Date for PT Re-Evaluation  05/17/18    Authorization Type  BCBS    PT Start Time  [phone]    PT Stop Time  1022    PT Time Calculation (min)  45 min    Activity Tolerance  Patient tolerated treatment well    Behavior During Therapy  [facility] for tasks assessed/performed       Past Medical History:  Diagnosis Date  . Back pain   . Chicken pox   . Laceration of head    short hospitalization due to this- from [facility]    Past Surgical History:  Procedure Laterality Date  . KNEE SURGERY Right 2008   meniscus  . WRIST SURGERY  2010   2 on wrist    There were no vitals filed for this visit.  Subjective Assessment - 04/22/18 0939    Subjective  Pt states minimal pain today. He worked and worked out yesterday with minimal pain.     Patient Stated Goals  Decreased pain     Currently in Pain?  Yes    Pain Score  1     Pain Location  Back    Pain Orientation  Left;Lower    Pain Descriptors / Indicators  Aching;Tightness    Pain Type  Chronic pain    Pain Onset  More than a month ago    Pain Frequency  Intermittent    Aggravating Factors   working, increased activity     Pain Relieving Factors  stretching, rest                       [facility] Adult PT Treatment/Exercise - 04/22/18 0942      Exercises   Exercises  Lumbar      Lumbar Exercises: Stretches   Active Hamstring Stretch  3 reps;30 seconds    Single Knee to Chest Stretch  --    Lower Trunk Rotation  5 reps;10 seconds    Other Lumbar Stretch Exercise  Childs pose Center 30 sec x2 L/R/C  each;       Lumbar  Exercises: Aerobic   Stationary Bike  L2 x 8 min      Lumbar Exercises: Standing   Theraband Level (Row)  Level 4 (Blue)    Other Standing Lumbar Exercises  HIp abd and ext RTB x20 each bil;       Lumbar Exercises: Supine   Bridge  20 reps    Straight Leg Raise  20 reps    Straight Leg Raises Limitations  with TA, Bil;     Large Ball Oblique Isometric Limitations  --    Other Supine Lumbar Exercises  --      Lumbar Exercises: Sidelying   Hip Abduction  --      Lumbar Exercises: Quadruped   Opposite Arm/Leg Raise Limitations  x10 LE/ x15 UE/LE    Plank  30 sec x3      Manual Therapy   Manual Therapy  Soft tissue mobilization;Joint mobilization    Soft tissue mobilization  STM with  tennis ball to L QL region;              PT Education - 04/22/18 0942    Education Details  Reviewed HEP    Person(s) Educated  Patient    Methods  Explanation    Comprehension  Verbalized understanding       PT Short Term Goals - 04/19/18 0859      PT SHORT TERM GOAL #1   Title  Pt to be independent with initial HEP     Time  2    Period  Weeks    Status  Achieved      PT SHORT TERM GOAL #2   Title  Pt to report decreased pain in lumbar region to 4/10 at worst/ with activity     Time  2    Period  Weeks    Status  Achieved        PT Long Term Goals - 04/19/18 0859      PT LONG TERM GOAL #1   Title  Pt to report decreased pain in lumbar region, to 0-2/10 with activity and work duties.     Time  6    Period  Weeks    Status  Partially Met      PT LONG TERM GOAL #2   Title  Pt to demo improved lumbar ROM to be [facility], to improve posture and ability for bending, lifting.     Time  6    Period  Weeks    Status  Partially Met      PT LONG TERM GOAL #3   Title  Pt to demo improved strength of core and hips to be at least 4+/5, with no postural changes seen with stability exercises.     Time  6    Period  Weeks    Status  On-going            Plan - 04/22/18 1126     Clinical Impression Statement  Pt with improving ability for ther ex, with decreasing postural changes with stabiliy exercises. He is very challenged with quadruped and planks, and also has difficulty with glute activation on L. Plan to progress strength as tolerated, as well as pain relief for L lumbar musculature.     Rehab Potential  Good    PT Frequency  2x / week    PT Duration  4 weeks    PT Treatment/Interventions  ADLs/Self Care Home Management;Cryotherapy;Electrical Stimulation;Iontophoresis 91m/ml Dexamethasone;Moist Heat;Therapeutic activities;Functional mobility training;Gait training;Ultrasound;Therapeutic exercise;Neuromuscular re-education;Patient/family education;Dry needling;Passive range of motion;Manual techniques;Taping;Joint Manipulations    Consulted and Agree with Plan of Care  Patient       Patient will benefit from skilled therapeutic intervention in order to improve the following deficits and impairments:  Abnormal gait, Hypomobility, Decreased activity tolerance, Decreased strength, Pain, Decreased mobility, Increased muscle spasms, Improper body mechanics, Decreased range of motion, Impaired flexibility  Visit Diagnosis: Chronic left-sided low back pain without sciatica     Problem List Patient Active Problem List   Diagnosis Date Noted  . Back pain 02/03/2018  . Hypothyroidism 02/01/2014  . Chronic rhinitis 07/14/2008  . Asthma 07/14/2008    LLyndee Hensen PT, DPT 11:30 AM  04/22/18      [facility] [address]Phone: [phone]  Fax:  [phone] Name: Ian RAMSEYERMRN: [id number] of Birth: [birth year]

## 2018-04-26 ENCOUNTER — Ambulatory Visit: Payer: BLUE CROSS/BLUE SHIELD | Admitting: Physical Therapy

## 2018-04-26 ENCOUNTER — Encounter: Payer: Self-pay | Admitting: Physical Therapy

## 2018-04-26 DIAGNOSIS — M545 Low back pain, unspecified: Secondary | ICD-10-CM

## 2018-04-26 DIAGNOSIS — G8929 Other chronic pain: Secondary | ICD-10-CM

## 2018-04-26 NOTE — Therapy (Signed)
[facility] [address] [place], [place], [address] Phone: [phone]   Fax:  [phone]  Physical Therapy Treatment  Patient Details  Name: [name] MRN: [id number] Date of Birth: 12-17-1990 Referring Provider (PT): Teresa Coombs   Encounter Date: 04/26/2018  PT End of Session - 04/26/18 1249    Visit Number  10    Number of Visits  20    Date for PT Re-Evaluation  05/17/18    Authorization Type  BCBS    PT Start Time  0945    PT Stop Time  1020    PT Time Calculation (min)  35 min    Activity Tolerance  Patient tolerated treatment well    Behavior During Therapy  [facility] for tasks assessed/performed       Past Medical History:  Diagnosis Date  . Back pain   . Chicken pox   . Laceration of head    short hospitalization due to this- from [facility]    Past Surgical History:  Procedure Laterality Date  . KNEE SURGERY Right 2008   meniscus  . WRIST SURGERY  2010   2 on wrist    There were no vitals filed for this visit.  Subjective Assessment - 04/26/18 1248    Subjective  Pt 15 min late to appt. Pt states minimal pain in back today and in last couple days.     Patient Stated Goals  Decreased pain     Currently in Pain?  Yes    Pain Score  1     Pain Location  Back    Pain Orientation  Left    Pain Descriptors / Indicators  Aching;Tightness    Pain Type  Chronic pain    Pain Onset  More than a month ago    Pain Frequency  Intermittent                       [facility] Adult PT Treatment/Exercise - 04/26/18 0947      Exercises   Exercises  Lumbar      Lumbar Exercises: Stretches   Active Hamstring Stretch  --    Lower Trunk Rotation  --    Other Lumbar Stretch Exercise  standing QL stretch 30 sec x3 bil;     Other Lumbar Stretch Exercise  Childs pose Center 30 sec x2 L/R/C  each;       Lumbar Exercises: Aerobic   Stationary Bike  --      Lumbar Exercises: Standing   Theraband Level (Row)  --     Other Standing Lumbar Exercises  --      Lumbar Exercises: Supine   Bridge  20 reps    Bridge Limitations  with LAQ    Straight Leg Raise  20 reps    Straight Leg Raises Limitations  with TA, Bil;     Large Ball Oblique Isometric Limitations  90/90 heel taps;     Other Supine Lumbar Exercises  Bicycle 2x10;       Lumbar Exercises: Quadruped   Opposite Arm/Leg Raise Limitations  x15 UE/LE    Plank  30 sec x3      Manual Therapy   Manual Therapy  Soft tissue mobilization;Joint mobilization    Soft tissue mobilization  DTM and IASTM to L QL region;                PT Short Term Goals - 04/19/18 2583  PT SHORT TERM GOAL #1   Title  Pt to be independent with initial HEP     Time  2    Period  Weeks    Status  Achieved      PT SHORT TERM GOAL #2   Title  Pt to report decreased pain in lumbar region to 4/10 at worst/ with activity     Time  2    Period  Weeks    Status  Achieved        PT Long Term Goals - 04/19/18 0859      PT LONG TERM GOAL #1   Title  Pt to report decreased pain in lumbar region, to 0-2/10 with activity and work duties.     Time  6    Period  Weeks    Status  Partially Met      PT LONG TERM GOAL #2   Title  Pt to demo improved lumbar ROM to be [facility], to improve posture and ability for bending, lifting.     Time  6    Period  Weeks    Status  Partially Met      PT LONG TERM GOAL #3   Title  Pt to demo improved strength of core and hips to be at least 4+/5, with no postural changes seen with stability exercises.     Time  6    Period  Weeks    Status  On-going            Plan - 04/26/18 1250    Clinical Impression Statement  Pt with mild soreness at L QL, but improved from previous sessions. Pt improving with ability for ther ex and stability. Plan to work towards d/c in next 2 weeks if pain continues to decrease.     Rehab Potential  Good    PT Frequency  2x / week    PT Duration  4 weeks    PT Treatment/Interventions   ADLs/Self Care Home Management;Cryotherapy;Electrical Stimulation;Iontophoresis 26m/ml Dexamethasone;Moist Heat;Therapeutic activities;Functional mobility training;Gait training;Ultrasound;Therapeutic exercise;Neuromuscular re-education;Patient/family education;Dry needling;Passive range of motion;Manual techniques;Taping;Joint Manipulations    Consulted and Agree with Plan of Care  Patient       Patient will benefit from skilled therapeutic intervention in order to improve the following deficits and impairments:  Abnormal gait, Hypomobility, Decreased activity tolerance, Decreased strength, Pain, Decreased mobility, Increased muscle spasms, Improper body mechanics, Decreased range of motion, Impaired flexibility  Visit Diagnosis: Chronic left-sided low back pain without sciatica     Problem List Patient Active Problem List   Diagnosis Date Noted  . Back pain 02/03/2018  . Hypothyroidism 02/01/2014  . Chronic rhinitis 07/14/2008  . Asthma 07/14/2008    [name] PT, DPT 12:51 PM  04/26/18    [place] [address]Phone: [phone]  Fax:  [phone] Name: Ian ROOKEMRN: [id number] of Birth: 902-22-1992

## 2018-04-30 ENCOUNTER — Encounter: Payer: Self-pay | Admitting: Physical Therapy

## 2018-04-30 ENCOUNTER — Ambulatory Visit: Payer: BLUE CROSS/BLUE SHIELD | Admitting: Physical Therapy

## 2018-04-30 DIAGNOSIS — M545 Low back pain, unspecified: Secondary | ICD-10-CM

## 2018-04-30 DIAGNOSIS — G8929 Other chronic pain: Secondary | ICD-10-CM

## 2018-04-30 NOTE — Therapy (Signed)
[facility] [address] [place], [place], [address] Phone: [phone]   Fax:  [phone]  Physical Therapy Treatment  Patient Details  Name: [name] MRN: [id number] Date of Birth: 25-May-1990 Referring Provider (PT): Teresa Coombs   Encounter Date: 04/30/2018  PT End of Session - 04/30/18 0936    Visit Number  11    Number of Visits  20    Date for PT Re-Evaluation  05/17/18    Authorization Type  BCBS    PT Start Time  0932    PT Stop Time  1014    PT Time Calculation (min)  42 min    Activity Tolerance  Patient tolerated treatment well    Behavior During Therapy  [facility] for tasks assessed/performed       Past Medical History:  Diagnosis Date  . Back pain   . Chicken pox   . Laceration of head    short hospitalization due to this- from [facility]    Past Surgical History:  Procedure Laterality Date  . KNEE SURGERY Right 2008   meniscus  . WRIST SURGERY  2010   2 on wrist    There were no vitals filed for this visit.  Subjective Assessment - 04/30/18 0935    Subjective  Pt states increased pain yesterday, was standing a lot over weekened, and sitting on different furniture.     Patient Stated Goals  Decreased pain     Currently in Pain?  Yes    Pain Score  2     Pain Location  Back    Pain Orientation  Left    Pain Descriptors / Indicators  Aching;Tightness    Pain Type  Chronic pain    Pain Onset  More than a month ago    Pain Frequency  Intermittent    Aggravating Factors   sitting in low car, working    Pain Relieving Factors  Stretching, rest                       [facility] Adult PT Treatment/Exercise - 04/30/18 0930      Exercises   Exercises  Lumbar      Lumbar Exercises: Stretches   Lower Trunk Rotation  5 reps;10 seconds    Other Lumbar Stretch Exercise  --    Other Lumbar Stretch Exercise  Childs pose Center 30 sec x2 L/R/C  each;       Lumbar Exercises: Aerobic   Stationary  Bike  L2 x 8 min      Lumbar Exercises: Standing   Other Standing Lumbar Exercises  Walk/march 25 ft x4; fwd and bwd    Other Standing Lumbar Exercises  HIp abd and ext RTB x20 each bil;       Lumbar Exercises: Supine   Bridge  20 reps    Bridge Limitations  with LAQ    Straight Leg Raise  --    Straight Leg Raises Limitations  --    Large Ball Oblique Isometric Limitations  90/90 heel taps;     Other Supine Lumbar Exercises  --    Other Supine Lumbar Exercises  modified crunch x20;       Lumbar Exercises: Quadruped   Opposite Arm/Leg Raise Limitations  x20 UE/LE    Plank  30 sec x3      Manual Therapy   Manual Therapy  Soft tissue mobilization;Joint mobilization    Soft tissue mobilization  DTM and  IASTM to L QL region in sidelying.               PT Short Term Goals - 04/19/18 0859      PT SHORT TERM GOAL #1   Title  Pt to be independent with initial HEP     Time  2    Period  Weeks    Status  Achieved      PT SHORT TERM GOAL #2   Title  Pt to report decreased pain in lumbar region to 4/10 at worst/ with activity     Time  2    Period  Weeks    Status  Achieved        PT Long Term Goals - 04/19/18 0859      PT LONG TERM GOAL #1   Title  Pt to report decreased pain in lumbar region, to 0-2/10 with activity and work duties.     Time  6    Period  Weeks    Status  Partially Met      PT LONG TERM GOAL #2   Title  Pt to demo improved lumbar ROM to be [facility], to improve posture and ability for bending, lifting.     Time  6    Period  Weeks    Status  Partially Met      PT LONG TERM GOAL #3   Title  Pt to demo improved strength of core and hips to be at least 4+/5, with no postural changes seen with stability exercises.     Time  6    Period  Weeks    Status  On-going            Plan - 04/30/18 1040    Clinical Impression Statement  Pt with mild soreness at L QL with deep palpation. No other surrounding pain in rib, spine, etc. Pt has been able to  progress the rex for strength, still requires cuing and has difficulty with L glute activation. Pts pain imrpoving, plan to progress to d/c in next few visits.     Rehab Potential  Good    PT Frequency  2x / week    PT Duration  4 weeks    PT Treatment/Interventions  ADLs/Self Care Home Management;Cryotherapy;Electrical Stimulation;Iontophoresis 96m/ml Dexamethasone;Moist Heat;Therapeutic activities;Functional mobility training;Gait training;Ultrasound;Therapeutic exercise;Neuromuscular re-education;Patient/family education;Dry needling;Passive range of motion;Manual techniques;Taping;Joint Manipulations    Consulted and Agree with Plan of Care  Patient       Patient will benefit from skilled therapeutic intervention in order to improve the following deficits and impairments:  Abnormal gait, Hypomobility, Decreased activity tolerance, Decreased strength, Pain, Decreased mobility, Increased muscle spasms, Improper body mechanics, Decreased range of motion, Impaired flexibility  Visit Diagnosis: Chronic left-sided low back pain without sciatica     Problem List Patient Active Problem List   Diagnosis Date Noted  . Back pain 02/03/2018  . Hypothyroidism 02/01/2014  . Chronic rhinitis 07/14/2008  . Asthma 07/14/2008    LLyndee Hensen PT, DPT 10:42 AM  04/30/18    [place] [address]Phone: [phone]  Fax:  [phone] Name: JRHET RORKEMRN: [id number] of Birth: 919-Oct-1992

## 2018-05-03 ENCOUNTER — Encounter: Payer: Self-pay | Admitting: Physical Therapy

## 2018-05-03 ENCOUNTER — Telehealth: Payer: Self-pay | Admitting: Family Medicine

## 2018-05-03 NOTE — Telephone Encounter (Signed)
[name] who states that the pt has 4 refills available and one could be processed for the pt to be filled on today.

## 2018-05-03 NOTE — Telephone Encounter (Signed)
Copied from CRM [phone]. Topic: Quick Communication - Rx Refill/Question >> May 03, 2018 12:17 PM Jilda Rocheemaray, Melissa wrote: Medication: levothyroxine (SYNTHROID, LEVOTHROID) 75 MCG tablet  Has the patient contacted their pharmacy? Yes.   (Agent: If no, request that the patient contact the pharmacy for the refill.) (Agent: If yes, when and what did the pharmacy advise?) Call office  Preferred Pharmacy (with phone number or street name): [id number] - , [place] - [address] [phone] (Phone) [phone] (Fax)    Agent: Please be advised that RX refills may take up to 3 business days. We ask that you follow-up with your pharmacy.

## 2018-05-06 ENCOUNTER — Encounter: Payer: Self-pay | Admitting: Physical Therapy

## 2018-05-06 ENCOUNTER — Ambulatory Visit: Payer: BLUE CROSS/BLUE SHIELD | Admitting: Physical Therapy

## 2018-05-06 DIAGNOSIS — M545 Low back pain: Secondary | ICD-10-CM | POA: Diagnosis not present

## 2018-05-06 DIAGNOSIS — G8929 Other chronic pain: Secondary | ICD-10-CM | POA: Diagnosis not present

## 2018-05-06 NOTE — Patient Instructions (Signed)
  Access Code: [email or web address]  URL: [email or web address]  Date: 05/06/2018  Prepared by: Sedalia MutaLauren Larra Crunkleton   Exercises  Supine Single Knee to Chest - 3 reps - 30 hold - 2x daily  Seated Hamstring Stretch - 3 reps - 30 hold - 3x daily  TL Sidebending Stretch - Single Arm Overhead - 3 reps - 30 hold - 2x daily  Child's Pose Stretch - 3 reps - 30 hold - 2x daily  Supine Bridge - 10 reps - 2 sets - 1x daily  Straight Leg Raise - 10 reps - 2 sets - 1x daily  Supine 90/90 Alternating Toe Touch - 10 reps - 2 sets - 1x daily  Supine Bicycles - 10 reps - 2 sets - 1x daily  Sidelying Hip Abduction - 10 reps - 2 sets - 1x daily  Standard Plank - 3 reps - 30 hold - 2x daily  Standing Hip Extension - 10 reps - 2 sets - 1x daily  Standing Repeated Hip Abduction with Resistance - 10 reps - 2 sets - 1x daily

## 2018-05-06 NOTE — Therapy (Signed)
[facility] [address] [place], [place], [address] Phone: [phone]   Fax:  [phone]  Physical Therapy Treatment  Patient Details  Name: OSMANY AZER MRN: [id number] Date of Birth: 1990/06/16 Referring Provider (PT): Teresa Coombs   Encounter Date: 05/06/2018  PT End of Session - 05/06/18 1018    Visit Number  12    Number of Visits  20    Date for PT Re-Evaluation  05/17/18    Authorization Type  BCBS    PT Start Time  0939    PT Stop Time  1017    PT Time Calculation (min)  38 min    Activity Tolerance  Patient tolerated treatment well    Behavior During Therapy  [facility] for tasks assessed/performed       Past Medical History:  Diagnosis Date  . Back pain   . Chicken pox   . Laceration of head    short hospitalization due to this- from [facility]    Past Surgical History:  Procedure Laterality Date  . KNEE SURGERY Right 2008   meniscus  . WRIST SURGERY  2010   2 on wrist    There were no vitals filed for this visit.  Subjective Assessment - 05/06/18 1010    Subjective  Pt has not had much pain, slight soreness only at times.     Currently in Pain?  No/denies    Pain Score  0-No pain    Pain Location  Back         [facility] PT Assessment - 05/06/18 0001      AROM   Overall AROM Comments  Lumbar ROM: WNL      Strength   Overall Strength Comments  5/5 LEs, 4+/5 core      Palpation   Palpation comment  No pain to palpate today                   [facility] Adult PT Treatment/Exercise - 05/06/18 1116      Exercises   Exercises  Lumbar      Lumbar Exercises: Stretches   Lower Trunk Rotation  --    Other Lumbar Stretch Exercise  Childs pose Center 30 sec x2 L/R/C  each;       Lumbar Exercises: Aerobic   Stationary Bike  --      Lumbar Exercises: Standing   Other Standing Lumbar Exercises  --    Other Standing Lumbar Exercises  --      Lumbar Exercises: Supine   Bridge  --    Bridge  Limitations  --    Straight Leg Raise  20 reps    Straight Leg Raises Limitations  with TA, Bil;     Large Ball Oblique Isometric Limitations  --    Other Supine Lumbar Exercises  Bicycle 2x10;     Other Supine Lumbar Exercises  modified crunch x20;       Lumbar Exercises: Sidelying   Hip Abduction  20 reps;Both      Lumbar Exercises: Quadruped   Madcat/Old Horse  20 reps    Opposite Arm/Leg Raise Limitations  --    Plank  --      Manual Therapy   Manual Therapy  Soft tissue mobilization;Joint mobilization    Manual therapy comments  Skilled palpation and monitoring of soft tissue during dry needling;     Joint Mobilization  PA mobs(gr 3)  to low thoraic and lumbar spine  Soft tissue mobilization  DTM and IASTM to L QL       Trigger Point Dry Needling - 05/06/18 1115    Consent Given?  Yes    Muscles Treated Lower Body  --   lumbar multifidus- L.           PT Education - 05/06/18 1018    Education Details  Education on Final HEP, recommendations for gym and progression of activity.    Person(s) Educated  Patient    Methods  Explanation;Handout;Demonstration    Comprehension  Verbalized understanding;Returned demonstration       PT Short Term Goals - 04/19/18 0859      PT SHORT TERM GOAL #1   Title  Pt to be independent with initial HEP     Time  2    Period  Weeks    Status  Achieved      PT SHORT TERM GOAL #2   Title  Pt to report decreased pain in lumbar region to 4/10 at worst/ with activity     Time  2    Period  Weeks    Status  Achieved        PT Long Term Goals - 05/06/18 1137      PT LONG TERM GOAL #1   Title  Pt to report decreased pain in lumbar region, to 0-2/10 with activity and work duties.     Time  6    Period  Weeks    Status  Achieved      PT LONG TERM GOAL #2   Title  Pt to demo improved lumbar ROM to be [facility], to improve posture and ability for bending, lifting.     Time  6    Period  Weeks    Status  Achieved      PT LONG  TERM GOAL #3   Title  Pt to demo improved strength of core and hips to be at least 4+/5, with no postural changes seen with stability exercises.     Time  6    Period  Weeks    Status  Achieved            Plan - 05/06/18 1138    Clinical Impression Statement  Pt has made good progress. He has minimal soreness at L lumbar region with palpation. Pt with soreness, but good tolerance with dry needling of multifidus. Pt with good understanding of final HEP, reviewed in detail today. Pt has met goals and is ready for d/c to HEP . Pt will be placed on hold for 2 weeks, will d/c at that time, pt only to return if he hsa increased pain or problem.     Rehab Potential  Good    PT Frequency  2x / week    PT Duration  4 weeks    PT Treatment/Interventions  ADLs/Self Care Home Management;Cryotherapy;Electrical Stimulation;Iontophoresis 71m/ml Dexamethasone;Moist Heat;Therapeutic activities;Functional mobility training;Gait training;Ultrasound;Therapeutic exercise;Neuromuscular re-education;Patient/family education;Dry needling;Passive range of motion;Manual techniques;Taping;Joint Manipulations    Consulted and Agree with Plan of Care  Patient       Patient will benefit from skilled therapeutic intervention in order to improve the following deficits and impairments:  Abnormal gait, Hypomobility, Decreased activity tolerance, Decreased strength, Pain, Decreased mobility, Increased muscle spasms, Improper body mechanics, Decreased range of motion, Impaired flexibility  Visit Diagnosis: Chronic left-sided low back pain without sciatica     Problem List Patient Active Problem List   Diagnosis Date Noted  . Back pain  02/03/2018  . Hypothyroidism 02/01/2014  . Chronic rhinitis 07/14/2008  . Asthma 07/14/2008    Lyndee Hensen, PT, DPT 11:53 AM  05/06/18    [facility] [place], [place], [address] Phone: [phone]   Fax:   [phone]  Name: NICHALOS BRENTON MRN: [id number] Date of Birth: [birth year]

## 2018-05-21 ENCOUNTER — Other Ambulatory Visit: Payer: BLUE CROSS/BLUE SHIELD

## 2018-06-02 ENCOUNTER — Other Ambulatory Visit: Payer: Self-pay | Admitting: Radiology

## 2018-06-02 DIAGNOSIS — E039 Hypothyroidism, unspecified: Secondary | ICD-10-CM

## 2018-06-03 ENCOUNTER — Other Ambulatory Visit (INDEPENDENT_AMBULATORY_CARE_PROVIDER_SITE_OTHER): Payer: BLUE CROSS/BLUE SHIELD

## 2018-06-03 DIAGNOSIS — E039 Hypothyroidism, unspecified: Secondary | ICD-10-CM | POA: Diagnosis not present

## 2018-06-03 LAB — TSH: TSH: 11.35 u[IU]/mL — ABNORMAL HIGH (ref 0.35–4.50)

## 2018-06-03 NOTE — Addendum Note (Signed)
Addended by: London Sheer T on: 06/03/2018 08:50 AM   Modules accepted: Orders

## 2018-06-07 MED ORDER — LEVOTHYROXINE SODIUM 88 MCG PO TABS: 88.0000 ug | ORAL_TABLET | Freq: Every day | ORAL | 3 refills | Status: DC

## 2018-06-07 NOTE — Addendum Note (Signed)
Addended by: Waymon Amato R on: 06/07/2018 12:32 PM   Modules accepted: Orders

## 2018-07-12 ENCOUNTER — Other Ambulatory Visit: Payer: BLUE CROSS/BLUE SHIELD

## 2018-08-03 ENCOUNTER — Other Ambulatory Visit: Payer: Self-pay | Admitting: Family Medicine

## 2018-08-03 MED ORDER — FLUTICASONE PROPIONATE 50 MCG/ACT NA SUSP: 2.0000 | Freq: Every day | NASAL | 5 refills | Status: DC

## 2018-10-14 DIAGNOSIS — R635 Abnormal weight gain: Secondary | ICD-10-CM | POA: Diagnosis not present

## 2018-10-14 DIAGNOSIS — E039 Hypothyroidism, unspecified: Secondary | ICD-10-CM | POA: Diagnosis not present

## 2018-10-14 DIAGNOSIS — E559 Vitamin D deficiency, unspecified: Secondary | ICD-10-CM | POA: Diagnosis not present

## 2018-10-14 DIAGNOSIS — E291 Testicular hypofunction: Secondary | ICD-10-CM | POA: Diagnosis not present

## 2018-10-18 DIAGNOSIS — Z1339 Encounter for screening examination for other mental health and behavioral disorders: Secondary | ICD-10-CM | POA: Diagnosis not present

## 2018-10-18 DIAGNOSIS — Z6829 Body mass index (BMI) 29.0-29.9, adult: Secondary | ICD-10-CM | POA: Diagnosis not present

## 2018-10-18 DIAGNOSIS — E7800 Pure hypercholesterolemia, unspecified: Secondary | ICD-10-CM | POA: Diagnosis not present

## 2018-10-18 DIAGNOSIS — Z1331 Encounter for screening for depression: Secondary | ICD-10-CM | POA: Diagnosis not present

## 2018-10-18 DIAGNOSIS — E291 Testicular hypofunction: Secondary | ICD-10-CM | POA: Diagnosis not present

## 2018-10-18 DIAGNOSIS — E559 Vitamin D deficiency, unspecified: Secondary | ICD-10-CM | POA: Diagnosis not present

## 2018-10-22 DIAGNOSIS — M9902 Segmental and somatic dysfunction of thoracic region: Secondary | ICD-10-CM | POA: Diagnosis not present

## 2018-10-22 DIAGNOSIS — M9903 Segmental and somatic dysfunction of lumbar region: Secondary | ICD-10-CM | POA: Diagnosis not present

## 2018-10-22 DIAGNOSIS — M5416 Radiculopathy, lumbar region: Secondary | ICD-10-CM | POA: Diagnosis not present

## 2018-10-22 DIAGNOSIS — M9905 Segmental and somatic dysfunction of pelvic region: Secondary | ICD-10-CM | POA: Diagnosis not present

## 2018-10-25 DIAGNOSIS — E291 Testicular hypofunction: Secondary | ICD-10-CM | POA: Diagnosis not present

## 2018-10-25 DIAGNOSIS — Z6829 Body mass index (BMI) 29.0-29.9, adult: Secondary | ICD-10-CM | POA: Diagnosis not present

## 2018-10-25 DIAGNOSIS — E7800 Pure hypercholesterolemia, unspecified: Secondary | ICD-10-CM | POA: Diagnosis not present

## 2018-10-26 DIAGNOSIS — M5416 Radiculopathy, lumbar region: Secondary | ICD-10-CM | POA: Diagnosis not present

## 2018-10-26 DIAGNOSIS — M9905 Segmental and somatic dysfunction of pelvic region: Secondary | ICD-10-CM | POA: Diagnosis not present

## 2018-10-26 DIAGNOSIS — M9903 Segmental and somatic dysfunction of lumbar region: Secondary | ICD-10-CM | POA: Diagnosis not present

## 2018-10-26 DIAGNOSIS — M9902 Segmental and somatic dysfunction of thoracic region: Secondary | ICD-10-CM | POA: Diagnosis not present

## 2018-10-28 DIAGNOSIS — M9905 Segmental and somatic dysfunction of pelvic region: Secondary | ICD-10-CM | POA: Diagnosis not present

## 2018-10-28 DIAGNOSIS — M9902 Segmental and somatic dysfunction of thoracic region: Secondary | ICD-10-CM | POA: Diagnosis not present

## 2018-10-28 DIAGNOSIS — M9903 Segmental and somatic dysfunction of lumbar region: Secondary | ICD-10-CM | POA: Diagnosis not present

## 2018-10-28 DIAGNOSIS — M5416 Radiculopathy, lumbar region: Secondary | ICD-10-CM | POA: Diagnosis not present

## 2018-11-01 DIAGNOSIS — Z6829 Body mass index (BMI) 29.0-29.9, adult: Secondary | ICD-10-CM | POA: Diagnosis not present

## 2018-11-01 DIAGNOSIS — E559 Vitamin D deficiency, unspecified: Secondary | ICD-10-CM | POA: Diagnosis not present

## 2018-11-01 DIAGNOSIS — E291 Testicular hypofunction: Secondary | ICD-10-CM | POA: Diagnosis not present

## 2019-05-10 DIAGNOSIS — E039 Hypothyroidism, unspecified: Secondary | ICD-10-CM | POA: Diagnosis not present

## 2019-05-10 DIAGNOSIS — E291 Testicular hypofunction: Secondary | ICD-10-CM | POA: Diagnosis not present

## 2019-05-10 DIAGNOSIS — R6882 Decreased libido: Secondary | ICD-10-CM | POA: Diagnosis not present

## 2019-05-17 DIAGNOSIS — E291 Testicular hypofunction: Secondary | ICD-10-CM | POA: Diagnosis not present

## 2019-05-17 DIAGNOSIS — Z6827 Body mass index (BMI) 27.0-27.9, adult: Secondary | ICD-10-CM | POA: Diagnosis not present

## 2019-05-17 DIAGNOSIS — R6882 Decreased libido: Secondary | ICD-10-CM | POA: Diagnosis not present

## 2019-05-17 DIAGNOSIS — E039 Hypothyroidism, unspecified: Secondary | ICD-10-CM | POA: Diagnosis not present

## 2019-05-24 DIAGNOSIS — E559 Vitamin D deficiency, unspecified: Secondary | ICD-10-CM | POA: Diagnosis not present

## 2019-05-24 DIAGNOSIS — Z6827 Body mass index (BMI) 27.0-27.9, adult: Secondary | ICD-10-CM | POA: Diagnosis not present

## 2019-05-24 DIAGNOSIS — E039 Hypothyroidism, unspecified: Secondary | ICD-10-CM | POA: Diagnosis not present

## 2019-05-31 DIAGNOSIS — Z6827 Body mass index (BMI) 27.0-27.9, adult: Secondary | ICD-10-CM | POA: Diagnosis not present

## 2019-05-31 DIAGNOSIS — E7800 Pure hypercholesterolemia, unspecified: Secondary | ICD-10-CM | POA: Diagnosis not present

## 2019-06-07 DIAGNOSIS — E559 Vitamin D deficiency, unspecified: Secondary | ICD-10-CM | POA: Diagnosis not present

## 2019-06-07 DIAGNOSIS — Z6827 Body mass index (BMI) 27.0-27.9, adult: Secondary | ICD-10-CM | POA: Diagnosis not present

## 2019-06-14 DIAGNOSIS — E7800 Pure hypercholesterolemia, unspecified: Secondary | ICD-10-CM | POA: Diagnosis not present

## 2019-06-14 DIAGNOSIS — Z6827 Body mass index (BMI) 27.0-27.9, adult: Secondary | ICD-10-CM | POA: Diagnosis not present

## 2019-06-28 DIAGNOSIS — E559 Vitamin D deficiency, unspecified: Secondary | ICD-10-CM | POA: Diagnosis not present

## 2019-06-28 DIAGNOSIS — Z6827 Body mass index (BMI) 27.0-27.9, adult: Secondary | ICD-10-CM | POA: Diagnosis not present

## 2019-07-01 DIAGNOSIS — H53432 Sector or arcuate defects, left eye: Secondary | ICD-10-CM | POA: Diagnosis not present

## 2019-07-05 DIAGNOSIS — E7800 Pure hypercholesterolemia, unspecified: Secondary | ICD-10-CM | POA: Diagnosis not present

## 2019-07-05 DIAGNOSIS — Z6827 Body mass index (BMI) 27.0-27.9, adult: Secondary | ICD-10-CM | POA: Diagnosis not present

## 2019-07-12 DIAGNOSIS — E559 Vitamin D deficiency, unspecified: Secondary | ICD-10-CM | POA: Diagnosis not present

## 2019-07-12 DIAGNOSIS — E291 Testicular hypofunction: Secondary | ICD-10-CM | POA: Diagnosis not present

## 2019-07-12 DIAGNOSIS — Z6827 Body mass index (BMI) 27.0-27.9, adult: Secondary | ICD-10-CM | POA: Diagnosis not present

## 2019-07-15 DIAGNOSIS — H47012 Ischemic optic neuropathy, left eye: Secondary | ICD-10-CM | POA: Diagnosis not present

## 2019-07-15 DIAGNOSIS — H43821 Vitreomacular adhesion, right eye: Secondary | ICD-10-CM | POA: Diagnosis not present

## 2019-08-02 DIAGNOSIS — E559 Vitamin D deficiency, unspecified: Secondary | ICD-10-CM | POA: Diagnosis not present

## 2019-08-02 DIAGNOSIS — Z6827 Body mass index (BMI) 27.0-27.9, adult: Secondary | ICD-10-CM | POA: Diagnosis not present

## 2019-08-02 DIAGNOSIS — E7800 Pure hypercholesterolemia, unspecified: Secondary | ICD-10-CM | POA: Diagnosis not present

## 2019-08-15 NOTE — Progress Notes (Signed)
Phone [phone] Virtual visit via Video note   Subjective:  Chief complaint: Chief Complaint  Patient presents with  . Chills    x 2 days did not have last night.   . Generalized Body Aches    x 2 days   . Sore Throat    x 2 days    This visit type was conducted due to national recommendations for restrictions regarding the COVID-19 Pandemic (e.g. social distancing).  This format is felt to be most appropriate for this patient at this time balancing risks to patient and risks to population by having him in for in person visit.  No physical exam was performed (except for noted visual exam or audio findings with Telehealth visits).    Our team/I connected with Mila Homer at  8:40 AM EDT by a video enabled telemedicine application (doxy.me or caregility through epic) and verified that I am speaking with the correct person using two identifiers.  Location patient: [facility] Location provider: [facility], office Persons participating in the virtual visit:  patient  Our team/I discussed the limitations of evaluation and management by telemedicine and the availability of in person appointments. In light of current covid-19 pandemic, patient also understands that we are trying to protect them by minimizing in office contact if at all possible.  The patient expressed consent for telemedicine visit and agreed to proceed. Patient understands insurance will be billed.   Past Medical History-  Patient Active Problem List   Diagnosis Date Noted  . Hypothyroidism 02/01/2014    Priority: Medium  . Back pain 02/03/2018    Priority: Low  . Allergic rhinitis 07/14/2008    Priority: Low  . Asthma 07/14/2008    Priority: Low    Medications- reviewed and updated Current Outpatient Medications  Medication Sig Dispense Refill  . clomiPHENE (CLOMID) 50 MG tablet     . fluticasone (FLONASE) 50 MCG/ACT nasal spray Place 2 sprays into both nostrils daily. 16 g 5  . levothyroxine (SYNTHROID,  LEVOTHROID) 88 MCG tablet Take 1 tablet (88 mcg total) by mouth daily. 90 tablet 3  . Multiple Vitamin (MULTIVITAMIN) tablet Take 1 tablet by mouth daily.    Marland Kitchen TADALAFIL PO Take by mouth.    . testosterone enanthate (DELATESTRYL) 200 MG/ML injection Inject into the muscle every 14 (fourteen) days. For IM use only    . doxycycline (VIBRA-TABS) 100 MG tablet Take 1 tablet (100 mg total) by mouth 2 (two) times daily. 20 tablet 0   No current facility-administered medications for this visit.     Objective:  Temp 98.4 F (36.9 C) (Oral)   Ht 6' (1.829 m)   Wt 200 lb (90.7 kg)   BMI 27.12 kg/m  self reported vitals Gen: NAD, appears fatigued Lungs: nonlabored, normal respiratory rate  Skin: appears dry, no obvious rash    Assessment and Plan   # cough/fatigue/sore throat #allergic rhinitis S: Symptoms started on Sunday - 2 days ago Symptoms include low grade fever, Chills, Sore throat- has not noted purulence in tonsils. Has had congestion. Denies any cough. No dyspnea on exertion or shortness of breath.  Denies any testing or exposure to covid 19. Has not had vaccination to covid 19. He did have covid back in December. Current symptoms are worse- previously just had loss of taste or smell.  Max temperature 100 overall- yesterday max was 99.3.   No known close sick contacts. Does work as Emergency planning/management officer. No one around himw ith strep throat.  Last Thursday did pull a tick off of him.  A/P: Allergies likely contributing. Does get some brown sputum production. Very similar this time of the year every year- The pollen really bothers him. Patient also had tick bite - cover for allergies with flonase -cover for sinusitis and RMSF and lyme with days of doxycyline -if he has new or worsening symptoms or fails to improve he should follow up with [place].   Patient with symptoms concerning for potential covid 19 (though I told him I thought this was unlikely) . patient visibly upset about the covid  testing and need to miss work until negative result- I agree with him its unlikely but we just have to be safe Therefore: -information provided on testing scheduling "please text "COVID" to [phone], OR you can log on to [email or web address] to easily make an on-line appointment. "  - recommended patient watch closely for shortness of breath or confusion or worsening symptoms and if those occur he should contact us immediately  -recommended patient consider purchasing pulse oximeter and if levels 94% or below persistently- seek care at the hospital  -recommended self isolation until negative test  at minimum. Also discussed potential for false negatives and to still be cautious even with negative test  - for quarantine if covid 19 test positive would need to be at least 10 days since first symptom AND at least 24 hours fever free without fever reducing medications AND improvement in respiratory symptoms  - we also discussed close contacts would need 14 day quarantine after last close contact with patient IF patient is positive  -Hopeful results back within 48 hours of test but may take up to a week  - work note not needed.   #hypothyroidism S: compliant On thyroid medication- levothyroxine 88 mcg Lab Results  Component Value Date   TSH 11.35 (H) 06/03/2018   A/P: last year levothyroxine was increased to 88 mcg. Plan was for 6 week repeat but that got disturbed by pandemic. I asked patient to come by for a tsh in next week or two then schedule a physical in 4-6 months   Recommended follow up: physical in 4-6 months scheduled by team Future Appointments  Date Time Provider [place]  02/03/2020  8:40 AM Marin Olp, MD [facility]    Lab/Order associations:   ICD-10-CM   1. Nasal congestion  R09.81   2. Hypothyroidism, unspecified type  E03.9 TSH  3. Seasonal allergic rhinitis due to pollen  J30.1   4. Fever, unspecified fever cause  R50.9   5. Tick bite, initial encounter   W57.XXXA     Meds ordered this encounter  Medications  . fluticasone (FLONASE) 50 MCG/ACT nasal spray    Sig: Place 2 sprays into both nostrils daily.    Dispense:  16 g    Refill:  5  . doxycycline (VIBRA-TABS) 100 MG tablet    Sig: Take 1 tablet (100 mg total) by mouth 2 (two) times daily.    Dispense:  20 tablet    Refill:  0   Return precautions advised.  Garret Reddish, MD

## 2019-08-16 ENCOUNTER — Telehealth (INDEPENDENT_AMBULATORY_CARE_PROVIDER_SITE_OTHER): Payer: BC Managed Care – PPO | Admitting: Family Medicine

## 2019-08-16 ENCOUNTER — Encounter: Payer: Self-pay | Admitting: Family Medicine

## 2019-08-16 VITALS — Temp 98.4°F | Ht 72.0 in | Wt 200.0 lb

## 2019-08-16 DIAGNOSIS — R509 Fever, unspecified: Secondary | ICD-10-CM

## 2019-08-16 DIAGNOSIS — E039 Hypothyroidism, unspecified: Secondary | ICD-10-CM | POA: Diagnosis not present

## 2019-08-16 DIAGNOSIS — Z6827 Body mass index (BMI) 27.0-27.9, adult: Secondary | ICD-10-CM | POA: Diagnosis not present

## 2019-08-16 DIAGNOSIS — J301 Allergic rhinitis due to pollen: Secondary | ICD-10-CM | POA: Diagnosis not present

## 2019-08-16 DIAGNOSIS — W57XXXA Bitten or stung by nonvenomous insect and other nonvenomous arthropods, initial encounter: Secondary | ICD-10-CM

## 2019-08-16 DIAGNOSIS — E7800 Pure hypercholesterolemia, unspecified: Secondary | ICD-10-CM | POA: Diagnosis not present

## 2019-08-16 DIAGNOSIS — R0981 Nasal congestion: Secondary | ICD-10-CM | POA: Diagnosis not present

## 2019-08-16 MED ORDER — DOXYCYCLINE HYCLATE 100 MG PO TABS: 100.0000 mg | ORAL_TABLET | Freq: Two times a day (BID) | ORAL | 0 refills | Status: DC

## 2019-08-16 MED ORDER — FLUTICASONE PROPIONATE 50 MCG/ACT NA SUSP: 2.0000 | Freq: Every day | NASAL | 5 refills | Status: AC

## 2019-08-16 NOTE — Patient Instructions (Addendum)
Health Maintenance Due  Topic Date Due  . TETANUS/TDAP not sure will address at next visit in office.  Never done   Recommended follow up: physical in 4-6 months

## 2019-08-16 NOTE — Assessment & Plan Note (Signed)
Allergies likely contributing to symptoms. Does get some brown sputum production. Very similar this time of the year every year- The pollen really bothers him. Patient also had tick bite - cover for allergies with flonase -cover for sinusitis and RMSF and lyme with days of doxycyline -if he has new or worsening symptoms or fails to improve he should follow up with [place].

## 2019-08-30 DIAGNOSIS — E559 Vitamin D deficiency, unspecified: Secondary | ICD-10-CM | POA: Diagnosis not present

## 2019-08-30 DIAGNOSIS — Z6827 Body mass index (BMI) 27.0-27.9, adult: Secondary | ICD-10-CM | POA: Diagnosis not present

## 2019-09-15 DIAGNOSIS — E039 Hypothyroidism, unspecified: Secondary | ICD-10-CM | POA: Diagnosis not present

## 2019-09-15 DIAGNOSIS — R6882 Decreased libido: Secondary | ICD-10-CM | POA: Diagnosis not present

## 2019-09-15 DIAGNOSIS — Z6828 Body mass index (BMI) 28.0-28.9, adult: Secondary | ICD-10-CM | POA: Diagnosis not present

## 2019-09-15 DIAGNOSIS — E7800 Pure hypercholesterolemia, unspecified: Secondary | ICD-10-CM | POA: Diagnosis not present

## 2019-09-15 DIAGNOSIS — Z72820 Sleep deprivation: Secondary | ICD-10-CM | POA: Diagnosis not present

## 2019-09-15 DIAGNOSIS — E291 Testicular hypofunction: Secondary | ICD-10-CM | POA: Diagnosis not present

## 2019-09-20 DIAGNOSIS — E039 Hypothyroidism, unspecified: Secondary | ICD-10-CM | POA: Diagnosis not present

## 2019-09-20 DIAGNOSIS — E291 Testicular hypofunction: Secondary | ICD-10-CM | POA: Diagnosis not present

## 2019-09-20 DIAGNOSIS — F4322 Adjustment disorder with anxiety: Secondary | ICD-10-CM | POA: Diagnosis not present

## 2019-09-22 DIAGNOSIS — E7800 Pure hypercholesterolemia, unspecified: Secondary | ICD-10-CM | POA: Diagnosis not present

## 2019-09-22 DIAGNOSIS — Z6828 Body mass index (BMI) 28.0-28.9, adult: Secondary | ICD-10-CM | POA: Diagnosis not present

## 2019-10-04 DIAGNOSIS — F4322 Adjustment disorder with anxiety: Secondary | ICD-10-CM | POA: Diagnosis not present

## 2019-10-18 DIAGNOSIS — F4322 Adjustment disorder with anxiety: Secondary | ICD-10-CM | POA: Diagnosis not present

## 2019-11-01 DIAGNOSIS — H53002 Unspecified amblyopia, left eye: Secondary | ICD-10-CM | POA: Diagnosis not present

## 2019-11-01 DIAGNOSIS — H469 Unspecified optic neuritis: Secondary | ICD-10-CM | POA: Diagnosis not present

## 2019-11-01 DIAGNOSIS — Q078 Other specified congenital malformations of nervous system: Secondary | ICD-10-CM | POA: Diagnosis not present

## 2019-11-01 DIAGNOSIS — H5212 Myopia, left eye: Secondary | ICD-10-CM | POA: Diagnosis not present

## 2019-11-01 DIAGNOSIS — H538 Other visual disturbances: Secondary | ICD-10-CM | POA: Diagnosis not present

## 2019-11-01 DIAGNOSIS — H5231 Anisometropia: Secondary | ICD-10-CM | POA: Diagnosis not present

## 2019-11-15 DIAGNOSIS — F4322 Adjustment disorder with anxiety: Secondary | ICD-10-CM | POA: Diagnosis not present

## 2019-11-29 DIAGNOSIS — F4322 Adjustment disorder with anxiety: Secondary | ICD-10-CM | POA: Diagnosis not present

## 2019-12-27 DIAGNOSIS — F4322 Adjustment disorder with anxiety: Secondary | ICD-10-CM | POA: Diagnosis not present

## 2020-01-10 DIAGNOSIS — F4322 Adjustment disorder with anxiety: Secondary | ICD-10-CM | POA: Diagnosis not present

## 2020-01-19 DIAGNOSIS — E291 Testicular hypofunction: Secondary | ICD-10-CM | POA: Diagnosis not present

## 2020-01-19 DIAGNOSIS — E039 Hypothyroidism, unspecified: Secondary | ICD-10-CM | POA: Diagnosis not present

## 2020-01-24 DIAGNOSIS — Z6828 Body mass index (BMI) 28.0-28.9, adult: Secondary | ICD-10-CM | POA: Diagnosis not present

## 2020-01-24 DIAGNOSIS — E291 Testicular hypofunction: Secondary | ICD-10-CM | POA: Diagnosis not present

## 2020-01-24 DIAGNOSIS — E0590 Thyrotoxicosis, unspecified without thyrotoxic crisis or storm: Secondary | ICD-10-CM | POA: Diagnosis not present

## 2020-02-03 ENCOUNTER — Encounter: Payer: BC Managed Care – PPO | Admitting: Family Medicine

## 2020-02-21 DIAGNOSIS — F4322 Adjustment disorder with anxiety: Secondary | ICD-10-CM | POA: Diagnosis not present

## 2020-04-11 ENCOUNTER — Encounter: Payer: Self-pay | Admitting: Physician Assistant

## 2020-04-11 ENCOUNTER — Ambulatory Visit: Payer: BC Managed Care – PPO | Admitting: Physician Assistant

## 2020-04-11 ENCOUNTER — Other Ambulatory Visit: Payer: Self-pay

## 2020-04-11 ENCOUNTER — Other Ambulatory Visit (HOSPITAL_COMMUNITY)
Admission: RE | Admit: 2020-04-11 | Discharge: 2020-04-11 | Disposition: A | Discharge status: Home / Self Care | Payer: BC Managed Care – PPO | Source: Ambulatory Visit | Attending: Physician Assistant | Admitting: Physician Assistant

## 2020-04-11 VITALS — BP 110/70 | HR 63 | Temp 98.6°F | Ht 72.0 in | Wt 228.2 lb

## 2020-04-11 DIAGNOSIS — E559 Vitamin D deficiency, unspecified: Secondary | ICD-10-CM | POA: Diagnosis not present

## 2020-04-11 DIAGNOSIS — M9903 Segmental and somatic dysfunction of lumbar region: Secondary | ICD-10-CM | POA: Diagnosis not present

## 2020-04-11 DIAGNOSIS — R635 Abnormal weight gain: Secondary | ICD-10-CM | POA: Diagnosis not present

## 2020-04-11 DIAGNOSIS — Z113 Encounter for screening for infections with a predominantly sexual mode of transmission: Secondary | ICD-10-CM | POA: Diagnosis not present

## 2020-04-11 DIAGNOSIS — E0590 Thyrotoxicosis, unspecified without thyrotoxic crisis or storm: Secondary | ICD-10-CM | POA: Diagnosis not present

## 2020-04-11 DIAGNOSIS — M9905 Segmental and somatic dysfunction of pelvic region: Secondary | ICD-10-CM | POA: Diagnosis not present

## 2020-04-11 DIAGNOSIS — M9902 Segmental and somatic dysfunction of thoracic region: Secondary | ICD-10-CM | POA: Diagnosis not present

## 2020-04-11 DIAGNOSIS — M5386 Other specified dorsopathies, lumbar region: Secondary | ICD-10-CM | POA: Diagnosis not present

## 2020-04-11 DIAGNOSIS — Z6830 Body mass index (BMI) 30.0-30.9, adult: Secondary | ICD-10-CM | POA: Diagnosis not present

## 2020-04-11 DIAGNOSIS — E7800 Pure hypercholesterolemia, unspecified: Secondary | ICD-10-CM | POA: Diagnosis not present

## 2020-04-11 DIAGNOSIS — E291 Testicular hypofunction: Secondary | ICD-10-CM | POA: Diagnosis not present

## 2020-04-11 DIAGNOSIS — L989 Disorder of the skin and subcutaneous tissue, unspecified: Secondary | ICD-10-CM | POA: Diagnosis not present

## 2020-04-11 DIAGNOSIS — Z79890 Hormone replacement therapy (postmenopausal): Secondary | ICD-10-CM | POA: Diagnosis not present

## 2020-04-11 DIAGNOSIS — F4322 Adjustment disorder with anxiety: Secondary | ICD-10-CM | POA: Diagnosis not present

## 2020-04-11 NOTE — Patient Instructions (Signed)
It was great to see you!  Blood work and urine testing today. I will be in touch with results. Please use condoms to prevent STDs.  If your ingrown hair gets worse, let me know.,  Take care,  Jarold Motto PA-C

## 2020-04-11 NOTE — Progress Notes (Signed)
[name] is a 29 y.o. male here for a new problem.  I acted as a Neurosurgeon for [employer], PA-C Corky Mull, LPN   History of Present Illness:   Chief Complaint  Patient presents with  . STD testing    HPI   STD testing Pt would like STD testing done today. He is recently sexually active with his ex-girlfriend. Denies any lesions, discharge, pain. Reports that his partner denies symptoms. He uses condoms inconsistently. Denies prior hx of STDs. Last sexually active around 2 weeks ago.  Skin lesion on face Patient has had a small, red raised bump to R jaw x 5 days. He gets these occasionally after shaving. Its slightly tender with significant touching. No discharge, fever, warmth, chills. Getting smaller with time.   Past Medical History:  Diagnosis Date  . Back pain   . Chicken pox   . Laceration of head    short hospitalization due to this- from [facility]     Social History   Tobacco Use  . Smoking status: Never Smoker  . Smokeless tobacco: Current User    Types: Snuff  Substance Use Topics  . Alcohol use: Yes    Alcohol/week: 0.0 - 2.0 standard drinks  . Drug use: No    Past Surgical History:  Procedure Laterality Date  . KNEE SURGERY Right 2008   meniscus  . WRIST SURGERY  2010   2 on wrist    Family History  Problem Relation Age of Onset  . Alcohol abuse Mother   . Healthy Father   . Healthy Brother   . Asthma Maternal Grandmother   . COPD Maternal Grandmother   . Depression Maternal Grandmother   . Hypertension Maternal Grandmother   . Heart disease Paternal Grandfather        CABG- unknown age  . Thyroid disease Neg Hx     No Known Allergies  Current Medications:   Current Outpatient Medications:  .  clomiPHENE (CLOMID) 50 MG tablet, , Disp: , Rfl:  .  fluticasone (FLONASE) 50 MCG/ACT nasal spray, Place 2 sprays into both nostrils daily., Disp: 16 g, Rfl: 5 .  levothyroxine (SYNTHROID) 150 MCG tablet, Take 150 mcg by mouth daily.,  Disp: , Rfl:  .  Multiple Vitamin (MULTIVITAMIN) tablet, Take 1 tablet by mouth daily., Disp: , Rfl:  .  TADALAFIL PO, Take by mouth., Disp: , Rfl:  .  testosterone enanthate (DELATESTRYL) 200 MG/ML injection, Inject into the muscle every 14 (fourteen) days. For IM use only, Disp: , Rfl:    Review of Systems:   ROS  Negative unless otherwise specified per HPI.  Vitals:   Vitals:   04/11/20 1401  BP: 110/70  Pulse: 63  Temp: 98.6 F (37 C)  TempSrc: Temporal  SpO2: 97%  Weight: 228 lb 4 oz (103.5 kg)  Height: 6' (1.829 m)     Body mass index is 30.96 kg/m.  Physical Exam:   Physical Exam Vitals and nursing note reviewed.  Constitutional:      General: He is not in acute distress.    Appearance: He is well-developed. He is not ill-appearing or toxic-appearing.  Cardiovascular:     Rate and Rhythm: Normal rate and regular rhythm.     Pulses: Normal pulses.     Heart sounds: Normal heart sounds, S1 normal and S2 normal.     Comments: No LE edema Pulmonary:     Effort: Pulmonary effort is normal.     Breath sounds:  Normal breath sounds.  Skin:    General: Skin is warm and dry.     Comments: Small erythematous raised lesion to R jaw line without induration or purulence  Neurological:     Mental Status: He is alert.     GCS: GCS eye subscore is 4. GCS verbal subscore is 5. GCS motor subscore is 6.  Psychiatric:        Speech: Speech normal.        Behavior: Behavior normal. Behavior is cooperative.     Assessment and Plan:   Parks was seen today for std testing.  Diagnoses and all orders for this visit:  Screening examination for STD (sexually transmitted disease) Will update STD testing today. Recommend condoms. Will treat if any positive results. -     HIV Antibody (routine testing w rflx); Future -     RPR; Future -     Urine cytology ancillary only -     HIV Antibody (routine testing w rflx) -     RPR  Facial lesion Suspect possible ingrown  hair. Since it is slowly improving we decided to do watchful waiting. If worsens or changes, will send in oral doxycycline vs topical bactroban depending on symptoms. I have asked patient to reach out to [place] to let us know.   CMA or LPN served as scribe during this visit. History, Physical, and Plan performed by medical provider. The above documentation has been reviewed and is accurate and complete.   Jarold Motto, PA-C

## 2020-04-12 LAB — RPR: RPR Ser Ql: NONREACTIVE

## 2020-04-12 LAB — URINE CYTOLOGY ANCILLARY ONLY
Chlamydia: NEGATIVE
Comment: NEGATIVE
Comment: NORMAL
Neisseria Gonorrhea: NEGATIVE

## 2020-04-12 LAB — HIV ANTIBODY (ROUTINE TESTING W REFLEX): HIV 1&2 Ab, 4th Generation: NONREACTIVE

## 2020-04-12 LAB — LAB REPORT - SCANNED: EGFR: 80

## 2020-04-13 DIAGNOSIS — E291 Testicular hypofunction: Secondary | ICD-10-CM | POA: Diagnosis not present

## 2020-04-13 DIAGNOSIS — E7800 Pure hypercholesterolemia, unspecified: Secondary | ICD-10-CM | POA: Diagnosis not present

## 2020-04-13 DIAGNOSIS — E0590 Thyrotoxicosis, unspecified without thyrotoxic crisis or storm: Secondary | ICD-10-CM | POA: Diagnosis not present

## 2020-04-20 DIAGNOSIS — E0590 Thyrotoxicosis, unspecified without thyrotoxic crisis or storm: Secondary | ICD-10-CM | POA: Diagnosis not present

## 2020-04-20 DIAGNOSIS — M9903 Segmental and somatic dysfunction of lumbar region: Secondary | ICD-10-CM | POA: Diagnosis not present

## 2020-04-20 DIAGNOSIS — E7800 Pure hypercholesterolemia, unspecified: Secondary | ICD-10-CM | POA: Diagnosis not present

## 2020-04-20 DIAGNOSIS — M9902 Segmental and somatic dysfunction of thoracic region: Secondary | ICD-10-CM | POA: Diagnosis not present

## 2020-04-20 DIAGNOSIS — E291 Testicular hypofunction: Secondary | ICD-10-CM | POA: Diagnosis not present

## 2020-04-20 DIAGNOSIS — M9905 Segmental and somatic dysfunction of pelvic region: Secondary | ICD-10-CM | POA: Diagnosis not present

## 2020-04-20 DIAGNOSIS — M5386 Other specified dorsopathies, lumbar region: Secondary | ICD-10-CM | POA: Diagnosis not present

## 2020-04-26 DIAGNOSIS — E7800 Pure hypercholesterolemia, unspecified: Secondary | ICD-10-CM | POA: Diagnosis not present

## 2020-04-26 DIAGNOSIS — M5386 Other specified dorsopathies, lumbar region: Secondary | ICD-10-CM | POA: Diagnosis not present

## 2020-04-26 DIAGNOSIS — M9902 Segmental and somatic dysfunction of thoracic region: Secondary | ICD-10-CM | POA: Diagnosis not present

## 2020-04-26 DIAGNOSIS — M9903 Segmental and somatic dysfunction of lumbar region: Secondary | ICD-10-CM | POA: Diagnosis not present

## 2020-04-26 DIAGNOSIS — M9905 Segmental and somatic dysfunction of pelvic region: Secondary | ICD-10-CM | POA: Diagnosis not present

## 2020-04-26 DIAGNOSIS — E0590 Thyrotoxicosis, unspecified without thyrotoxic crisis or storm: Secondary | ICD-10-CM | POA: Diagnosis not present

## 2020-04-26 DIAGNOSIS — E291 Testicular hypofunction: Secondary | ICD-10-CM | POA: Diagnosis not present

## 2020-06-05 ENCOUNTER — Encounter: Payer: BC Managed Care – PPO | Admitting: Family Medicine

## 2020-06-12 DIAGNOSIS — M9903 Segmental and somatic dysfunction of lumbar region: Secondary | ICD-10-CM | POA: Diagnosis not present

## 2020-06-12 DIAGNOSIS — M9902 Segmental and somatic dysfunction of thoracic region: Secondary | ICD-10-CM | POA: Diagnosis not present

## 2020-06-12 DIAGNOSIS — M5386 Other specified dorsopathies, lumbar region: Secondary | ICD-10-CM | POA: Diagnosis not present

## 2020-06-12 DIAGNOSIS — M9905 Segmental and somatic dysfunction of pelvic region: Secondary | ICD-10-CM | POA: Diagnosis not present

## 2020-06-26 DIAGNOSIS — F4322 Adjustment disorder with anxiety: Secondary | ICD-10-CM | POA: Diagnosis not present

## 2020-07-17 IMAGING — DX DG LUMBAR SPINE COMPLETE 4+V
4 series · 4 of 4 positions shown · non-contrast
Comparison: Lumbar spine MRI 09/10/2016.

CLINICAL DATA: Back pain.  Fall.

EXAM:
LUMBAR SPINE - COMPLETE 4+ VIEW

[lumbar spine ap]
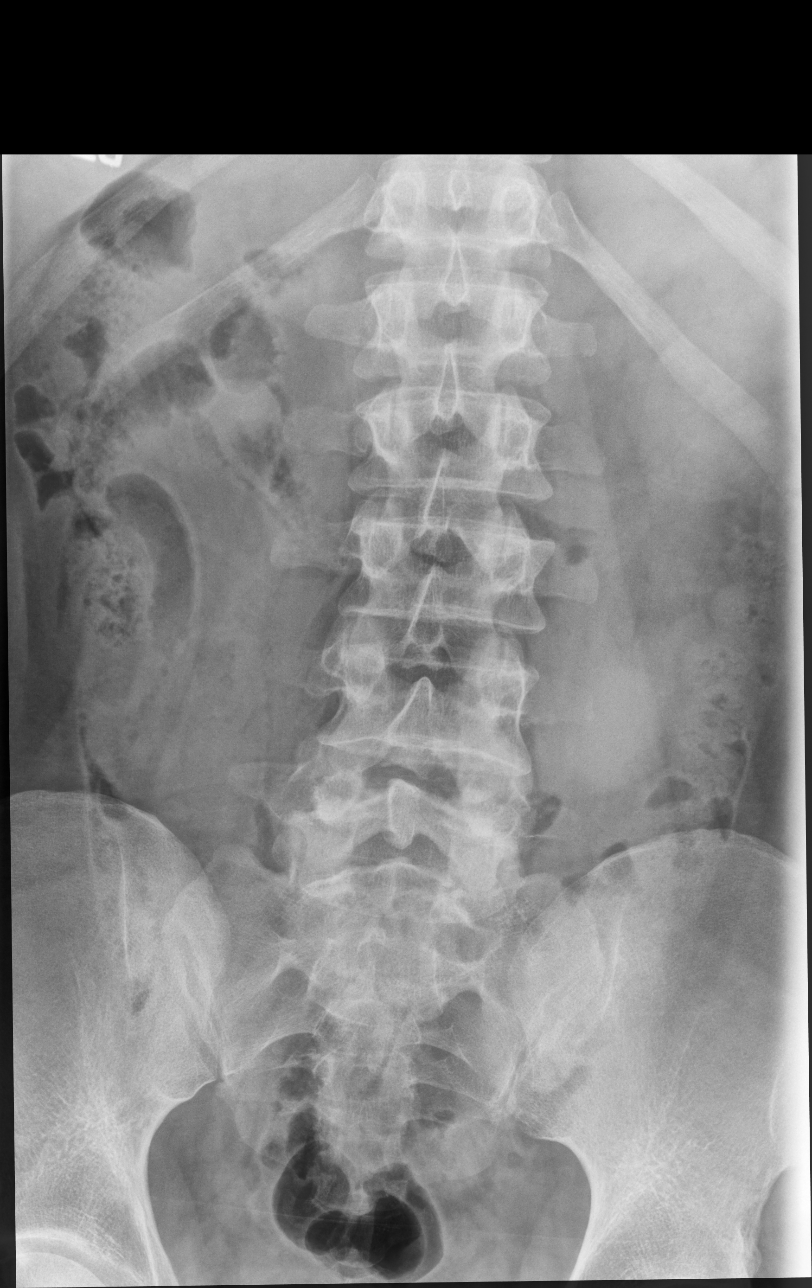

[lumbar spine oblique (1 of 2)]
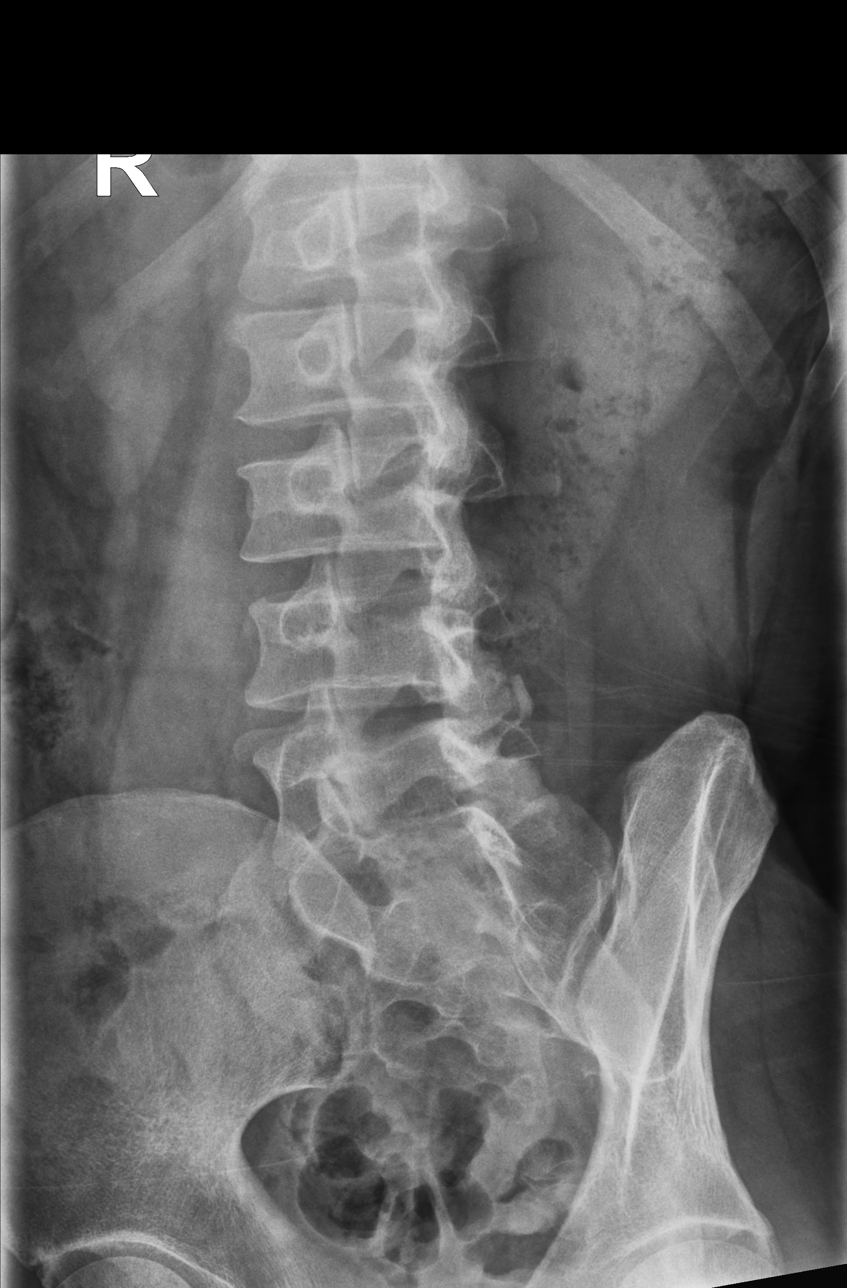

[lumbar spine oblique (2 of 2)]
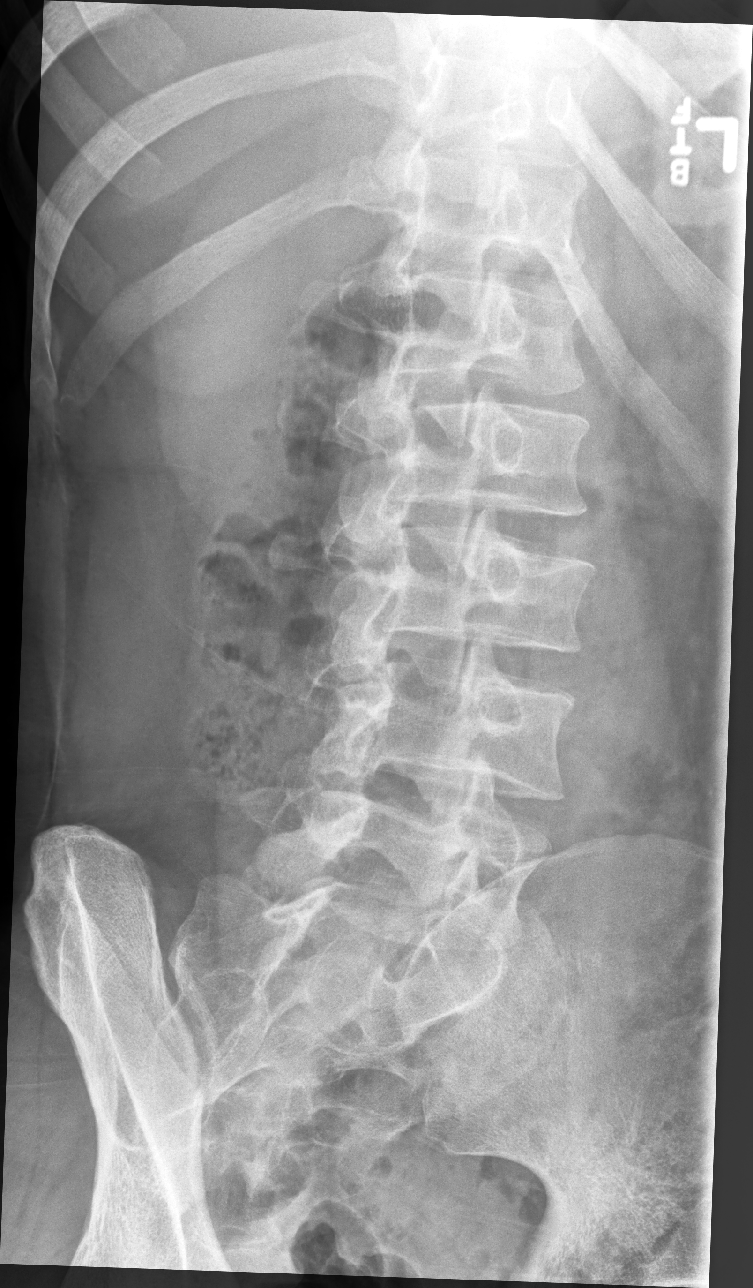

[lumbar spine lat]
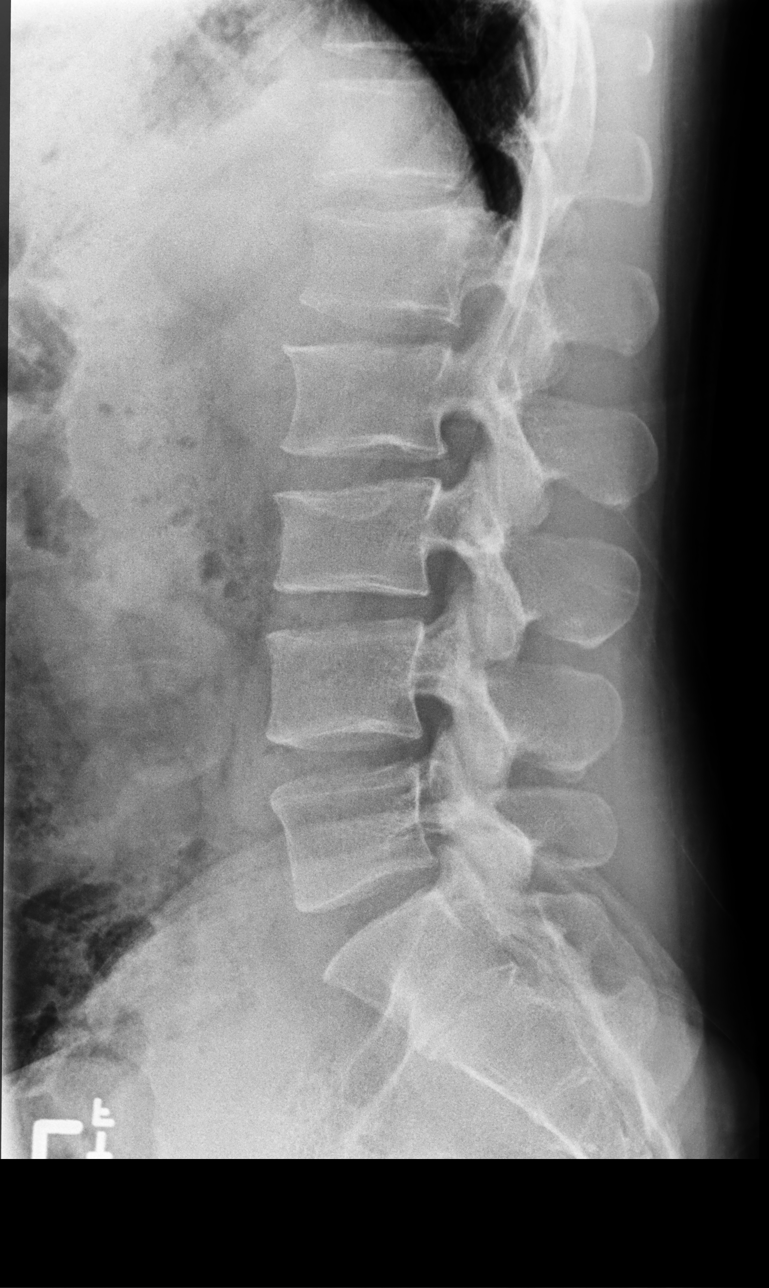

[4 of 4 positions shown; findings below may reference images not displayed]

FINDINGS: Lumbar spine numbered as per prior MRI. Soft tissue structures are
unremarkable. Mild scoliosis concave right. Minimal anterior wedging
of L3 and L1 again noted. No interim change. No acute bony
abnormality identified. Diffuse multilevel degenerative change.
IMPRESSION: No acute abnormality identified. No evidence of acute fracture. Exam
stable from prior MRI of 09/10/2016.

## 2020-08-15 ENCOUNTER — Telehealth: Payer: Self-pay

## 2020-08-15 NOTE — Telephone Encounter (Signed)
Called patient to get scheduled for a follow up appointment, [name] stated he will call me back when he had his work schedule.

## 2020-08-15 NOTE — Telephone Encounter (Signed)
Noted  

## 2020-09-25 DIAGNOSIS — F4322 Adjustment disorder with anxiety: Secondary | ICD-10-CM | POA: Diagnosis not present

## 2020-10-08 ENCOUNTER — Other Ambulatory Visit: Payer: Self-pay

## 2020-10-08 ENCOUNTER — Encounter (INDEPENDENT_AMBULATORY_CARE_PROVIDER_SITE_OTHER): Payer: Self-pay | Admitting: Internal Medicine

## 2020-10-08 ENCOUNTER — Ambulatory Visit (INDEPENDENT_AMBULATORY_CARE_PROVIDER_SITE_OTHER): Payer: BC Managed Care – PPO | Admitting: Internal Medicine

## 2020-10-08 VITALS — BP 124/76 | HR 66 | Temp 97.5°F | Ht 70.25 in | Wt 222.4 lb

## 2020-10-08 DIAGNOSIS — R7989 Other specified abnormal findings of blood chemistry: Secondary | ICD-10-CM | POA: Diagnosis not present

## 2020-10-08 DIAGNOSIS — E559 Vitamin D deficiency, unspecified: Secondary | ICD-10-CM | POA: Diagnosis not present

## 2020-10-08 DIAGNOSIS — E039 Hypothyroidism, unspecified: Secondary | ICD-10-CM

## 2020-10-08 DIAGNOSIS — R5381 Other malaise: Secondary | ICD-10-CM

## 2020-10-08 DIAGNOSIS — E291 Testicular hypofunction: Secondary | ICD-10-CM | POA: Diagnosis not present

## 2020-10-08 DIAGNOSIS — R5383 Other fatigue: Secondary | ICD-10-CM

## 2020-10-08 DIAGNOSIS — E785 Hyperlipidemia, unspecified: Secondary | ICD-10-CM

## 2020-10-08 NOTE — Progress Notes (Signed)
Metrics: Intervention Frequency ACO  Documented Smoking Status Yearly  Screened one or more times in 24 months  Cessation Counseling or  Active cessation medication Past 24 months  Past 24 months   Guideline developer: UpToDate (See UpToDate for funding source) Date Released: 2014       Wellness Office Visit  Subjective:  Patient ID: [name], male    DOB: [birth year]  Age: 30 y.o. MRN: [id number]  CC: This 30 year old man comes to our practice as a new patient to establish care. HPI  He has been seeing [facility] in [place] for therapy regarding low testosterone levels and also his thyroid. He is on levothyroxine.  He has not taken testosterone for over 1 month. He apparently at one time was on Arimidex and clomiphene in addition to the testosterone. He does have a history of hyperlipidemia. He wants to become healthier, lose weight, have energy and endurance. He does describe fatigue, erectile dysfunction and decreased libido. Past Medical History:  Diagnosis Date  . Back pain   . Chicken pox   . Laceration of head    short hospitalization due to this- from [facility]  . Thyroid disease    Past Surgical History:  Procedure Laterality Date  . KNEE SURGERY Right 2008   meniscus  . WRIST SURGERY  2010   2 on wrist     Family History  Problem Relation Age of Onset  . Alcohol abuse Mother   . Healthy Father   . Healthy Brother   . Asthma Maternal Grandmother   . COPD Maternal Grandmother   . Depression Maternal Grandmother   . Hypertension Maternal Grandmother   . Heart disease Paternal Grandfather        CABG- unknown age  . Thyroid disease Neg Hx     Social History   Social History Narrative   Single but dating- GF for 3 years.. Lives alone.      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       Hobbies: working out, outdoors   Social History   Tobacco Use  . Smoking status: Never Smoker  . Smokeless tobacco: Current User    Types:  Snuff  Substance Use Topics  . Alcohol use: Yes    Alcohol/week: 4.0 - 6.0 standard drinks    Types: 4 - 6 Cans of beer per week    Current Meds  Medication Sig  . anastrozole (ARIMIDEX) 1 MG tablet Take by mouth.  . clomiPHENE (CLOMID) 50 MG tablet   . fluticasone (FLONASE) 50 MCG/ACT nasal spray Place 2 sprays into both nostrils daily.  Marland Kitchen levothyroxine (SYNTHROID) 150 MCG tablet Take 150 mcg by mouth daily.  . Multiple Vitamin (MULTIVITAMIN) tablet Take 1 tablet by mouth daily.  Marland Kitchen TADALAFIL PO Take by mouth.  . testosterone enanthate (DELATESTRYL) 200 MG/ML injection Inject into the muscle once a week. For IM use only     Flowsheet Row Office Visit from 10/08/2020 in [facility]  PHQ-9 Total Score 0      Objective:   Today's Vitals: BP 124/76   Pulse 66   Temp (!) 97.5 F (36.4 C) (Temporal)   Ht 5' 10.25" (1.784 m)   Wt 222 lb 6.4 oz (100.9 kg)   SpO2 99%   BMI 31.68 kg/m  Vitals with BMI 10/08/2020 04/11/2020 08/16/2019  Height 5' 10.25" 6\' 0"  6\' 0"   Weight 222 lbs 6 oz 228 lbs 4 oz 200 lbs  BMI 31.7  30.95 27.12  Systolic 124 110 -  Diastolic 76 70 -  Pulse 66 63 -     Physical Exam   He is obese.  Blood pressure is acceptable.    Assessment   1. Acquired hypothyroidism   2. Low testosterone in male   3. Dyslipidemia   4. Malaise and fatigue   5. Vitamin D deficiency disease       Tests ordered Orders Placed This Encounter  Procedures  . COMPLETE METABOLIC PANEL WITH GFR  . VITAMIN D 25 Hydroxy (Vit-D Deficiency, Fractures)  . Lipid panel  . Testosterone Total,Free,Bio, Males  . DHEA-sulfate  . Prolactin  . CBC     Plan: 1. I have told him that I would like to switch him from levothyroxine to NP thyroid and after discussion, he is agreeable.  I have given him samples of NP thyroid 90 mg tablets, take 1 daily. 2. Other blood work is ordered including his testosterone levels. 3. I discussed nutrition and the importance of  intermittent fasting combined with a plant-based diet.  I have given him a diet sheet. 4. I will see him in the next 3 weeks or so to discuss all his results and further recommendations.  I spent 45 minutes with this patient today discussing all of the above and reviewing his previous medical records   No orders of the defined types were placed in this encounter.   Wilson Singer, MD

## 2020-10-08 NOTE — Patient Instructions (Signed)
[name] Dietary Recommendations for Weight Loss What to Avoid . Avoid added sugars o Often added sugar can be found in processed foods such as many condiments, dry cereals, cakes, cookies, chips, crisps, crackers, candies, sweetened drinks, etc.  o Read labels and AVOID/DECREASE use of foods with the following in their ingredient list: Sugar, fructose, high fructose corn syrup, sucrose, glucose, maltose, dextrose, molasses, cane sugar, brown sugar, any type of syrup, agave nectar, etc.   . Avoid snacking in between meals . Avoid foods made with flour o If you are going to eat food made with flour, choose those made with whole-grains; and, minimize your consumption as much as is tolerable . Avoid processed foods o These foods are generally stocked in the middle of the grocery store. Focus on shopping on the perimeter of the grocery.  . Avoid Meat  o We recommend following a plant-based diet at [name]. Thus, we recommend avoiding meat as a general rule. Consider eating beans, legumes, eggs, and/or dairy products for regular protein sources o If you plan on eating meat limit to 4 ounces of meat at a time and choose lean options such as Fish, chicken, turkey. Avoid red meat intake such as pork and/or steak What to Include . Vegetables o GREEN LEAFY VEGETABLES: Kale, spinach, mustard greens, collard greens, cabbage, broccoli, etc. o OTHER: Asparagus, cauliflower, eggplant, carrots, peas, Brussel sprouts, tomatoes, bell peppers, zucchini, beets, cucumbers, etc. . Grains, seeds, and legumes o Beans: kidney beans, black eyed peas, garbanzo beans, black beans, pinto beans, etc. o Whole, unrefined grains: brown rice, barley, bulgur, oatmeal, etc. . Healthy fats  o Avoid highly processed fats such as vegetable oil o Examples of healthy fats: avocado, olives, virgin olive oil, dark chocolate (?72% Cocoa), nuts (peanuts, almonds, walnuts, cashews, pecans, etc.) . None to Low  Intake of Animal Sources of Protein o Meat sources: chicken, turkey, salmon, tuna. Limit to 4 ounces of meat at one time. o Consider limiting dairy sources, but when choosing dairy focus on: PLAIN Greek yogurt, cottage cheese, high-protein milk . Fruit o Choose berries  When to Eat . Intermittent Fasting: o Choosing not to eat for a specific time period, but DO FOCUS ON HYDRATION when fasting o Multiple Techniques: - Time Restricted Eating: eat 3 meals in a day, each meal lasting no more than 60 minutes, no snacks between meals - 16-18 hour fast: fast for 16 to 18 hours up to 7 days a week. Often suggested to start with 2-3 nonconsecutive days per week.  . Remember the time you sleep is counted as fasting.  . Examples of eating schedule: Fast from 7:00pm-11:00am. Eat between 11:00am-7:00pm.  - 24-hour fast: fast for 24 hours up to every other day. Often suggested to start with 1 day per week . Remember the time you sleep is counted as fasting . Examples of eating schedule:  o Eating day: eat 2-3 meals on your eating day. If doing 2 meals, each meal should last no more than 90 minutes. If doing 3 meals, each meal should last no more than 60 minutes. Finish last meal by 7:00pm. o Fasting day: Fast until 7:00pm.  o IF YOU FEEL UNWELL FOR ANY REASON/IN ANY WAY WHEN FASTING, STOP FASTING BY EATING A NUTRITIOUS SNACK OR LIGHT MEAL o ALWAYS FOCUS ON HYDRATION DURING FASTS - Acceptable Hydration sources: water, broths, tea/coffee (black tea/coffee is best but using a small amount of whole-fat dairy products in coffee/tea is acceptable).  -   Poor Hydration Sources: anything with sugar or artificial sweeteners added to it  These recommendations have been developed for patients that are actively receiving medical care from either Dr. Jasten Guyette or Sarah Gray, DNP, NP-C at [name]. These recommendations are developed for patients with specific medical conditions and are not meant to be  distributed or used by others that are not actively receiving care from either provider listed above at [name]. It is not appropriate to participate in the above eating plans without proper medical supervision.   Reference: Fung, J. The obesity code. [place]: [place]; 2016.   

## 2020-10-09 LAB — COMPLETE METABOLIC PANEL WITH GFR
AG Ratio: 1.8 (calc) (ref 1.0–2.5)
ALT: 32 U/L (ref 9–46)
AST: 19 U/L (ref 10–40)
Albumin: 4.7 g/dL (ref 3.6–5.1)
Alkaline phosphatase (APISO): 49 U/L (ref 36–130)
BUN: 19 mg/dL (ref 7–25)
CO2: 27 mmol/L (ref 20–32)
Calcium: 9.9 mg/dL (ref 8.6–10.3)
Chloride: 102 mmol/L (ref 98–110)
Creat: 0.91 mg/dL (ref 0.60–1.35)
GFR, Est African American: 130 mL/min/{1.73_m2} (ref >=60)
GFR, Est Non African American: 112 mL/min/{1.73_m2} (ref >=60)
Globulin: 2.6 g/dL (calc) (ref 1.9–3.7)
Glucose, Bld: 90 mg/dL (ref 65–99)
Potassium: 4.7 mmol/L (ref 3.5–5.3)
Sodium: 138 mmol/L (ref 135–146)
Total Bilirubin: 0.6 mg/dL (ref 0.2–1.2)
Total Protein: 7.3 g/dL (ref 6.1–8.1)

## 2020-10-09 LAB — CBC
HCT: 49.3 % (ref 38.5–50.0)
Hemoglobin: 16.3 g/dL (ref 13.2–17.1)
MCH: 30.4 pg (ref 27.0–33.0)
MCHC: 33.1 g/dL (ref 32.0–36.0)
MCV: 92 fL (ref 80.0–100.0)
MPV: 10 fL (ref 7.5–12.5)
Platelets: 294 10*3/uL (ref 140–400)
RBC: 5.36 10*6/uL (ref 4.20–5.80)
RDW: 12 % (ref 11.0–15.0)
WBC: 4.5 10*3/uL (ref 3.8–10.8)

## 2020-10-09 LAB — DHEA-SULFATE: DHEA-SO4: 490 ug/dL — ABNORMAL HIGH (ref 93–415)

## 2020-10-09 LAB — PROLACTIN: Prolactin: 3.6 ng/mL (ref 2.0–18.0)

## 2020-10-09 LAB — LIPID PANEL
Cholesterol: 160 mg/dL (ref <200)
HDL: 61 mg/dL (ref >=40)
LDL Cholesterol (Calc): 85 mg/dL (calc)
Non-HDL Cholesterol (Calc): 99 mg/dL (calc) (ref <130)
Total CHOL/HDL Ratio: 2.6 (calc) (ref <5.0)
Triglycerides: 61 mg/dL (ref <150)

## 2020-10-09 LAB — VITAMIN D 25 HYDROXY (VIT D DEFICIENCY, FRACTURES): Vit D, 25-Hydroxy: 29 ng/mL — ABNORMAL LOW (ref 30–100)

## 2020-10-09 LAB — TESTOSTERONE TOTAL,FREE,BIO, MALES
Albumin: 4.7 g/dL (ref 3.6–5.1)
Sex Hormone Binding: 23 nmol/L (ref 10–50)
Testosterone: 229 ng/dL — ABNORMAL LOW (ref 250–827)

## 2020-10-30 ENCOUNTER — Encounter (INDEPENDENT_AMBULATORY_CARE_PROVIDER_SITE_OTHER): Payer: Self-pay | Admitting: Internal Medicine

## 2020-10-30 ENCOUNTER — Other Ambulatory Visit: Payer: Self-pay

## 2020-10-30 ENCOUNTER — Ambulatory Visit (INDEPENDENT_AMBULATORY_CARE_PROVIDER_SITE_OTHER): Payer: BC Managed Care – PPO | Admitting: Internal Medicine

## 2020-10-30 VITALS — BP 130/70 | HR 87 | Temp 97.6°F | Resp 18 | Ht 70.0 in | Wt 218.0 lb

## 2020-10-30 DIAGNOSIS — R7989 Other specified abnormal findings of blood chemistry: Secondary | ICD-10-CM | POA: Diagnosis not present

## 2020-10-30 DIAGNOSIS — E039 Hypothyroidism, unspecified: Secondary | ICD-10-CM

## 2020-10-30 DIAGNOSIS — E559 Vitamin D deficiency, unspecified: Secondary | ICD-10-CM

## 2020-10-30 LAB — T3, FREE: T3, Free: 4 pg/mL (ref 2.3–4.2)

## 2020-10-30 LAB — TSH: TSH: 3.25 mIU/L (ref 0.40–4.50)

## 2020-10-30 MED ORDER — CLOMIPHENE CITRATE 50 MG PO TABS: 50.0000 mg | ORAL_TABLET | Freq: Every day | ORAL | 3 refills | Status: DC

## 2020-10-30 NOTE — Progress Notes (Signed)
Metrics: Intervention Frequency ACO  Documented Smoking Status Yearly  Screened one or more times in 24 months  Cessation Counseling or  Active cessation medication Past 24 months  Past 24 months   Guideline developer: UpToDate (See UpToDate for funding source) Date Released: 2014       Wellness Office Visit  Subjective:  Patient ID: [name], male    DOB: [birth year]  Age: 30 y.o. MRN: [id number]  CC: This man comes in for follow-up of hypothyroidism and low testosterone levels. HPI  Since starting on NP thyroid 90 mg daily, he does feel better but would like to have more energy. I reviewed all his blood work with him which shows that he has vitamin D deficiency and clearly low testosterone levels.  Remaining blood work is mostly unremarkable. Past Medical History:  Diagnosis Date   Back pain    Chicken pox    Laceration of head    short hospitalization due to this- from [facility]   Thyroid disease    Past Surgical History:  Procedure Laterality Date   KNEE SURGERY Right 2008   meniscus   WRIST SURGERY  2010   2 on wrist     Family History  Problem Relation Age of Onset   Alcohol abuse Mother    Healthy Father    Healthy Brother    Asthma Maternal Grandmother    COPD Maternal Grandmother    Depression Maternal Grandmother    Hypertension Maternal Grandmother    Heart disease Paternal Grandfather        CABG- unknown age   Thyroid disease Neg Hx     Social History   Social History Narrative   Single but dating- GF for 3 years.. Lives alone.      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       Hobbies: working out, outdoors   Social History   Tobacco Use   Smoking status: Never   Smokeless tobacco: Current    Types: Snuff  Substance Use Topics   Alcohol use: Yes    Alcohol/week: 4.0 - 6.0 standard drinks    Types: 4 - 6 Cans of beer per week    Current Meds  Medication Sig   clomiPHENE (CLOMID) 50 MG tablet Take 1 tablet (50 mg  total) by mouth daily.   fluticasone (FLONASE) 50 MCG/ACT nasal spray Place 2 sprays into both nostrils daily.   Multiple Vitamin (MULTIVITAMIN) tablet Take 1 tablet by mouth daily.   TADALAFIL PO Take by mouth.   thyroid (NP THYROID) 90 MG tablet Take 90 mg by mouth daily.   [DISCONTINUED] clomiPHENE (CLOMID) 50 MG tablet Take 50 mg by mouth daily. Taking 1 1/2 tab a day     Flowsheet Row Office Visit from 10/08/2020 in [facility]  PHQ-9 Total Score 0       Objective:   Today's Vitals: BP 130/70 (BP Location: Right Arm, Patient Position: Sitting, Cuff Size: Large)   Pulse 87   Temp 97.6 F (36.4 C) (Temporal)   Resp 18   Ht 5\' 10"  (1.778 m)   Wt 218 lb (98.9 kg)   SpO2 98% Comment: wearing mask  BMI 31.28 kg/m  Vitals with BMI 10/30/2020 10/08/2020 04/11/2020  Height 5\' 10"  5' 10.25" 6\' 0"   Weight 218 lbs 222 lbs 6 oz 228 lbs 4 oz  BMI 31.28 31.7 30.95  Systolic 130 124  Diastolic 70 76 70  Pulse 87 66 63  Physical Exam  He looks systemically well.  He has lost about 4 pounds since last visit.  Blood pressure is in a good range.     Assessment   1. Low testosterone in male   2. Acquired hypothyroidism   3. Vitamin D deficiency disease       Tests ordered Orders Placed This Encounter  Procedures   T3, free   TSH      Plan: 1.  I will start him back on clomiphene 50 mg daily for his hypogonadism.  He is not a candidate for testosterone therapy at his age. 2.  Continue with NP thyroid 90 mg daily and we will check thyroid function today to see if we need to make further adjustments. 3.  I recommended he start taking vitamin D3 10,000 units daily from [facility]. 4.  I will see him in about 2 months time for follow-up    Meds ordered this encounter  Medications   clomiPHENE (CLOMID) 50 MG tablet    Sig: Take 1 tablet (50 mg total) by mouth daily.    Dispense:  30 tablet    Refill:  3    Ian Nadal Normajean Glasgow, MD

## 2020-10-30 NOTE — Progress Notes (Signed)
Still tired, but better then before. Want to see if you will take over refilling to clomiphene? He is changing to come to you instead.

## 2020-10-31 ENCOUNTER — Other Ambulatory Visit (INDEPENDENT_AMBULATORY_CARE_PROVIDER_SITE_OTHER): Payer: Self-pay | Admitting: Internal Medicine

## 2020-10-31 MED ORDER — NP THYROID 120 MG PO TABS
120.0000 mg | ORAL_TABLET | Freq: Every day | ORAL | 3 refills | Status: DC
Start: 2020-10-31 — End: 2022-05-10

## 2020-11-15 DIAGNOSIS — F4322 Adjustment disorder with anxiety: Secondary | ICD-10-CM | POA: Diagnosis not present

## 2021-01-01 ENCOUNTER — Ambulatory Visit (INDEPENDENT_AMBULATORY_CARE_PROVIDER_SITE_OTHER): Payer: BC Managed Care – PPO | Admitting: Internal Medicine

## 2021-01-30 DIAGNOSIS — M62830 Muscle spasm of back: Secondary | ICD-10-CM | POA: Diagnosis not present

## 2021-01-30 DIAGNOSIS — M9903 Segmental and somatic dysfunction of lumbar region: Secondary | ICD-10-CM | POA: Diagnosis not present

## 2021-01-30 DIAGNOSIS — M9902 Segmental and somatic dysfunction of thoracic region: Secondary | ICD-10-CM | POA: Diagnosis not present

## 2021-01-30 DIAGNOSIS — M546 Pain in thoracic spine: Secondary | ICD-10-CM | POA: Diagnosis not present

## 2021-02-01 DIAGNOSIS — M9903 Segmental and somatic dysfunction of lumbar region: Secondary | ICD-10-CM | POA: Diagnosis not present

## 2021-02-01 DIAGNOSIS — M9902 Segmental and somatic dysfunction of thoracic region: Secondary | ICD-10-CM | POA: Diagnosis not present

## 2021-02-01 DIAGNOSIS — M546 Pain in thoracic spine: Secondary | ICD-10-CM | POA: Diagnosis not present

## 2021-02-01 DIAGNOSIS — M62830 Muscle spasm of back: Secondary | ICD-10-CM | POA: Diagnosis not present

## 2021-02-04 DIAGNOSIS — M546 Pain in thoracic spine: Secondary | ICD-10-CM | POA: Diagnosis not present

## 2021-02-04 DIAGNOSIS — M62830 Muscle spasm of back: Secondary | ICD-10-CM | POA: Diagnosis not present

## 2021-02-04 DIAGNOSIS — M9903 Segmental and somatic dysfunction of lumbar region: Secondary | ICD-10-CM | POA: Diagnosis not present

## 2021-02-04 DIAGNOSIS — M9902 Segmental and somatic dysfunction of thoracic region: Secondary | ICD-10-CM | POA: Diagnosis not present

## 2021-02-06 DIAGNOSIS — M9902 Segmental and somatic dysfunction of thoracic region: Secondary | ICD-10-CM | POA: Diagnosis not present

## 2021-02-06 DIAGNOSIS — M546 Pain in thoracic spine: Secondary | ICD-10-CM | POA: Diagnosis not present

## 2021-02-06 DIAGNOSIS — M62830 Muscle spasm of back: Secondary | ICD-10-CM | POA: Diagnosis not present

## 2021-02-06 DIAGNOSIS — M9903 Segmental and somatic dysfunction of lumbar region: Secondary | ICD-10-CM | POA: Diagnosis not present

## 2021-02-11 DIAGNOSIS — M62830 Muscle spasm of back: Secondary | ICD-10-CM | POA: Diagnosis not present

## 2021-02-11 DIAGNOSIS — M9903 Segmental and somatic dysfunction of lumbar region: Secondary | ICD-10-CM | POA: Diagnosis not present

## 2021-02-11 DIAGNOSIS — M9902 Segmental and somatic dysfunction of thoracic region: Secondary | ICD-10-CM | POA: Diagnosis not present

## 2021-02-11 DIAGNOSIS — M546 Pain in thoracic spine: Secondary | ICD-10-CM | POA: Diagnosis not present

## 2021-02-13 DIAGNOSIS — M9902 Segmental and somatic dysfunction of thoracic region: Secondary | ICD-10-CM | POA: Diagnosis not present

## 2021-02-13 DIAGNOSIS — M62830 Muscle spasm of back: Secondary | ICD-10-CM | POA: Diagnosis not present

## 2021-02-13 DIAGNOSIS — M9903 Segmental and somatic dysfunction of lumbar region: Secondary | ICD-10-CM | POA: Diagnosis not present

## 2021-02-13 DIAGNOSIS — M546 Pain in thoracic spine: Secondary | ICD-10-CM | POA: Diagnosis not present

## 2021-02-22 DIAGNOSIS — M546 Pain in thoracic spine: Secondary | ICD-10-CM | POA: Diagnosis not present

## 2021-02-22 DIAGNOSIS — M9902 Segmental and somatic dysfunction of thoracic region: Secondary | ICD-10-CM | POA: Diagnosis not present

## 2021-02-22 DIAGNOSIS — M62830 Muscle spasm of back: Secondary | ICD-10-CM | POA: Diagnosis not present

## 2021-02-22 DIAGNOSIS — M9903 Segmental and somatic dysfunction of lumbar region: Secondary | ICD-10-CM | POA: Diagnosis not present

## 2021-03-25 DIAGNOSIS — E0590 Thyrotoxicosis, unspecified without thyrotoxic crisis or storm: Secondary | ICD-10-CM | POA: Diagnosis not present

## 2021-03-25 DIAGNOSIS — Z79890 Hormone replacement therapy (postmenopausal): Secondary | ICD-10-CM | POA: Diagnosis not present

## 2021-03-25 DIAGNOSIS — E291 Testicular hypofunction: Secondary | ICD-10-CM | POA: Diagnosis not present

## 2021-04-01 DIAGNOSIS — Z72820 Sleep deprivation: Secondary | ICD-10-CM | POA: Diagnosis not present

## 2021-04-01 DIAGNOSIS — R6882 Decreased libido: Secondary | ICD-10-CM | POA: Diagnosis not present

## 2021-04-01 DIAGNOSIS — Z6831 Body mass index (BMI) 31.0-31.9, adult: Secondary | ICD-10-CM | POA: Diagnosis not present

## 2021-04-01 DIAGNOSIS — E291 Testicular hypofunction: Secondary | ICD-10-CM | POA: Diagnosis not present

## 2021-05-01 ENCOUNTER — Other Ambulatory Visit: Payer: Self-pay

## 2021-05-01 ENCOUNTER — Encounter: Payer: Self-pay | Admitting: Family Medicine

## 2021-05-01 ENCOUNTER — Telehealth (INDEPENDENT_AMBULATORY_CARE_PROVIDER_SITE_OTHER): Payer: BC Managed Care – PPO | Admitting: Family Medicine

## 2021-05-01 DIAGNOSIS — R0981 Nasal congestion: Secondary | ICD-10-CM | POA: Diagnosis not present

## 2021-05-01 MED ORDER — PREDNISONE 20 MG PO TABS: 40.0000 mg | ORAL_TABLET | Freq: Every day | ORAL | 0 refills | Status: AC

## 2021-05-01 MED ORDER — AMOXICILLIN 875 MG PO TABS: 875.0000 mg | ORAL_TABLET | Freq: Two times a day (BID) | ORAL | 0 refills | Status: DC

## 2021-05-01 NOTE — Progress Notes (Signed)
MyChart Video Visit    Virtual Visit via Video Note   This visit type was conducted due to national recommendations for restrictions regarding the COVID-19 Pandemic (e.g. social distancing) in an effort to limit this patient's exposure and mitigate transmission in our community. This patient is at least at moderate risk for complications without adequate follow up. This format is felt to be most appropriate for this patient at this time. Physical exam was limited by quality of the video and audio technology used for the visit. CMA was able to get the patient set up on a video visit.  Patient location: Home. Patient and provider in visit Provider location: Office  I discussed the limitations of evaluation and management by telemedicine and the availability of in person appointments. The patient expressed understanding and agreed to proceed.  Visit Date: 05/01/2021  Today's healthcare provider: Angelena Sole., MD     Subjective:    Patient ID: [name], male    DOB: [birth year], 30 y.o.   MRN: [id number]  Chief Complaint  Patient presents with   Generalized Body Aches    Started last Tuesday   Sore Throat   Fever    Started last Tuesday, broke on Thursday   Chills   Nasal Congestion    Have a lot of pressure in head Has been taking OTC medication     HPI 1 wk sick. Congestion, sore throat, myalgias Fever was 102 for 1 day, but ok Still a lot of congestion and HA.  Coughing thick.  No sob  Did not test for covid   Past Medical History:  Diagnosis Date   Back pain    Chicken pox    Laceration of head    short hospitalization due to this- from [facility]   Thyroid disease     Past Surgical History:  Procedure Laterality Date   KNEE SURGERY Right 2008   meniscus   WRIST SURGERY  2010   2 on wrist    Outpatient Medications Prior to Visit  Medication Sig Dispense Refill   clomiPHENE (CLOMID) 50 MG tablet Take 1 tablet (50 mg total) by mouth daily. 30 tablet  3   fluticasone (FLONASE) 50 MCG/ACT nasal spray Place 2 sprays into both nostrils daily. 16 g 5   Multiple Vitamin (MULTIVITAMIN) tablet Take 1 tablet by mouth daily.     NP THYROID 120 MG tablet Take 1 tablet (120 mg total) by mouth daily before breakfast. 30 tablet 3   TADALAFIL PO Take by mouth.     No facility-administered medications prior to visit.    No Known Allergies      Objective:     Physical Exam Vitals and nursing note reviewed.  Constitutional:      General:  is not in acute distress.    Appearance: Normal appearance.  HENT:     Head: Normocephalic. Very congested Pulmonary:     Effort: No respiratory distress.   Skin:    General: Skin is dry.     Coloration: Skin is not pale.  Neurological:     Mental Status: Pt is alert and oriented to person, place, and time.  Psychiatric:        Mood and Affect: Mood normal.   There were no vitals taken for this visit.  Wt Readings from Last 3 Encounters:  10/30/20 218 lb (98.9 kg)  10/08/20 222 lb 6.4 oz (100.9 kg)  04/11/20 228 lb 4 oz (103.5 kg)  Assessment & Plan:   Problem List Items Addressed This Visit   None Visit Diagnoses     Nasal congestion    -  Primary     .  Nasal congestion-poss post covid/flu.  Will do prednisone to help w/congestion and nasal saline.  Will send amox, but try to see if pred helps first as prob still viral.   No orders of the defined types were placed in this encounter.   I discussed the assessment and treatment plan with the patient. The patient was provided an opportunity to ask questions and all were answered. The patient agreed with the plan and demonstrated an understanding of the instructions.   The patient was advised to call back or seek an in-person evaluation if the symptoms worsen or if the condition fails to improve as anticipated.  I provided 13 minutes of face-to-face time during this encounter.   Angelena Sole., MD [facility]  [place] [phone] (phone) [phone] (fax)  [facility]

## 2021-05-01 NOTE — Patient Instructions (Signed)
Meds have been sent the the pharmacy °You can take tylenol for pain/fevers °If worsening symptoms, let us know or go to the Emergency room  ° ° °

## 2021-06-10 DIAGNOSIS — Z79890 Hormone replacement therapy (postmenopausal): Secondary | ICD-10-CM | POA: Diagnosis not present

## 2021-06-10 DIAGNOSIS — E0590 Thyrotoxicosis, unspecified without thyrotoxic crisis or storm: Secondary | ICD-10-CM | POA: Diagnosis not present

## 2021-06-10 DIAGNOSIS — E559 Vitamin D deficiency, unspecified: Secondary | ICD-10-CM | POA: Diagnosis not present

## 2021-06-10 DIAGNOSIS — E291 Testicular hypofunction: Secondary | ICD-10-CM | POA: Diagnosis not present

## 2021-06-17 DIAGNOSIS — E0590 Thyrotoxicosis, unspecified without thyrotoxic crisis or storm: Secondary | ICD-10-CM | POA: Diagnosis not present

## 2021-06-17 DIAGNOSIS — E559 Vitamin D deficiency, unspecified: Secondary | ICD-10-CM | POA: Diagnosis not present

## 2021-06-17 DIAGNOSIS — Z72820 Sleep deprivation: Secondary | ICD-10-CM | POA: Diagnosis not present

## 2021-06-17 DIAGNOSIS — R7989 Other specified abnormal findings of blood chemistry: Secondary | ICD-10-CM | POA: Diagnosis not present

## 2021-10-07 ENCOUNTER — Encounter: Payer: BC Managed Care – PPO | Admitting: Family Medicine

## 2021-10-21 DIAGNOSIS — E291 Testicular hypofunction: Secondary | ICD-10-CM | POA: Diagnosis not present

## 2021-10-21 DIAGNOSIS — E559 Vitamin D deficiency, unspecified: Secondary | ICD-10-CM | POA: Diagnosis not present

## 2021-10-21 DIAGNOSIS — Z79890 Hormone replacement therapy (postmenopausal): Secondary | ICD-10-CM | POA: Diagnosis not present

## 2021-10-21 DIAGNOSIS — E0590 Thyrotoxicosis, unspecified without thyrotoxic crisis or storm: Secondary | ICD-10-CM | POA: Diagnosis not present

## 2021-10-28 DIAGNOSIS — E291 Testicular hypofunction: Secondary | ICD-10-CM | POA: Diagnosis not present

## 2021-10-28 DIAGNOSIS — R6882 Decreased libido: Secondary | ICD-10-CM | POA: Diagnosis not present

## 2021-10-28 DIAGNOSIS — Z6829 Body mass index (BMI) 29.0-29.9, adult: Secondary | ICD-10-CM | POA: Diagnosis not present

## 2021-10-28 DIAGNOSIS — E0590 Thyrotoxicosis, unspecified without thyrotoxic crisis or storm: Secondary | ICD-10-CM | POA: Diagnosis not present

## 2021-12-11 DIAGNOSIS — Z6832 Body mass index (BMI) 32.0-32.9, adult: Secondary | ICD-10-CM | POA: Diagnosis not present

## 2021-12-11 DIAGNOSIS — Z131 Encounter for screening for diabetes mellitus: Secondary | ICD-10-CM | POA: Diagnosis not present

## 2021-12-11 DIAGNOSIS — R635 Abnormal weight gain: Secondary | ICD-10-CM | POA: Diagnosis not present

## 2021-12-11 DIAGNOSIS — E291 Testicular hypofunction: Secondary | ICD-10-CM | POA: Diagnosis not present

## 2021-12-11 DIAGNOSIS — E7800 Pure hypercholesterolemia, unspecified: Secondary | ICD-10-CM | POA: Diagnosis not present

## 2021-12-11 DIAGNOSIS — R7989 Other specified abnormal findings of blood chemistry: Secondary | ICD-10-CM | POA: Diagnosis not present

## 2021-12-11 DIAGNOSIS — E0590 Thyrotoxicosis, unspecified without thyrotoxic crisis or storm: Secondary | ICD-10-CM | POA: Diagnosis not present

## 2021-12-11 DIAGNOSIS — E559 Vitamin D deficiency, unspecified: Secondary | ICD-10-CM | POA: Diagnosis not present

## 2021-12-11 DIAGNOSIS — Z79890 Hormone replacement therapy (postmenopausal): Secondary | ICD-10-CM | POA: Diagnosis not present

## 2021-12-13 DIAGNOSIS — Z1339 Encounter for screening examination for other mental health and behavioral disorders: Secondary | ICD-10-CM | POA: Diagnosis not present

## 2021-12-13 DIAGNOSIS — E291 Testicular hypofunction: Secondary | ICD-10-CM | POA: Diagnosis not present

## 2021-12-13 DIAGNOSIS — E0590 Thyrotoxicosis, unspecified without thyrotoxic crisis or storm: Secondary | ICD-10-CM | POA: Diagnosis not present

## 2021-12-13 DIAGNOSIS — R635 Abnormal weight gain: Secondary | ICD-10-CM | POA: Diagnosis not present

## 2021-12-13 DIAGNOSIS — Z1331 Encounter for screening for depression: Secondary | ICD-10-CM | POA: Diagnosis not present

## 2021-12-13 DIAGNOSIS — Z6831 Body mass index (BMI) 31.0-31.9, adult: Secondary | ICD-10-CM | POA: Diagnosis not present

## 2021-12-23 DIAGNOSIS — E7800 Pure hypercholesterolemia, unspecified: Secondary | ICD-10-CM | POA: Diagnosis not present

## 2021-12-23 DIAGNOSIS — E291 Testicular hypofunction: Secondary | ICD-10-CM | POA: Diagnosis not present

## 2021-12-23 DIAGNOSIS — Z6831 Body mass index (BMI) 31.0-31.9, adult: Secondary | ICD-10-CM | POA: Diagnosis not present

## 2022-01-27 ENCOUNTER — Encounter: Payer: Self-pay | Admitting: *Deleted

## 2022-02-06 DIAGNOSIS — F4322 Adjustment disorder with anxiety: Secondary | ICD-10-CM | POA: Diagnosis not present

## 2022-02-20 DIAGNOSIS — F4322 Adjustment disorder with anxiety: Secondary | ICD-10-CM | POA: Diagnosis not present

## 2022-03-05 DIAGNOSIS — M67332 Transient synovitis, left wrist: Secondary | ICD-10-CM | POA: Diagnosis not present

## 2022-03-26 DIAGNOSIS — F4322 Adjustment disorder with anxiety: Secondary | ICD-10-CM | POA: Diagnosis not present

## 2022-04-16 DIAGNOSIS — E0590 Thyrotoxicosis, unspecified without thyrotoxic crisis or storm: Secondary | ICD-10-CM | POA: Diagnosis not present

## 2022-04-16 DIAGNOSIS — Z79890 Hormone replacement therapy (postmenopausal): Secondary | ICD-10-CM | POA: Diagnosis not present

## 2022-04-16 DIAGNOSIS — E291 Testicular hypofunction: Secondary | ICD-10-CM | POA: Diagnosis not present

## 2022-04-17 ENCOUNTER — Encounter: Payer: Self-pay | Admitting: *Deleted

## 2022-04-18 DIAGNOSIS — Z6830 Body mass index (BMI) 30.0-30.9, adult: Secondary | ICD-10-CM | POA: Diagnosis not present

## 2022-04-18 DIAGNOSIS — R6882 Decreased libido: Secondary | ICD-10-CM | POA: Diagnosis not present

## 2022-04-18 DIAGNOSIS — Z72820 Sleep deprivation: Secondary | ICD-10-CM | POA: Diagnosis not present

## 2022-04-18 DIAGNOSIS — E291 Testicular hypofunction: Secondary | ICD-10-CM | POA: Diagnosis not present

## 2022-04-22 DIAGNOSIS — F4322 Adjustment disorder with anxiety: Secondary | ICD-10-CM | POA: Diagnosis not present

## 2022-05-07 ENCOUNTER — Ambulatory Visit (INDEPENDENT_AMBULATORY_CARE_PROVIDER_SITE_OTHER): Payer: BC Managed Care – PPO | Admitting: Family Medicine

## 2022-05-07 ENCOUNTER — Encounter: Payer: Self-pay | Admitting: Family Medicine

## 2022-05-07 VITALS — BP 100/68 | HR 97 | Temp 98.2°F | Ht 70.0 in | Wt 242.4 lb

## 2022-05-07 DIAGNOSIS — E039 Hypothyroidism, unspecified: Secondary | ICD-10-CM

## 2022-05-07 DIAGNOSIS — E559 Vitamin D deficiency, unspecified: Secondary | ICD-10-CM | POA: Diagnosis not present

## 2022-05-07 DIAGNOSIS — Z1159 Encounter for screening for other viral diseases: Secondary | ICD-10-CM

## 2022-05-07 DIAGNOSIS — Z23 Encounter for immunization: Secondary | ICD-10-CM | POA: Diagnosis not present

## 2022-05-07 DIAGNOSIS — R5383 Other fatigue: Secondary | ICD-10-CM

## 2022-05-07 LAB — CBC WITH DIFFERENTIAL/PLATELET
Basophils Absolute: 0.1 10*3/uL (ref 0.0–0.1)
Basophils Relative: 1.1 % (ref 0.0–3.0)
Eosinophils Absolute: 0.2 10*3/uL (ref 0.0–0.7)
Eosinophils Relative: 3 % (ref 0.0–5.0)
HCT: 49.2 % (ref 39.0–52.0)
Hemoglobin: 17 g/dL (ref 13.0–17.0)
Lymphocytes Relative: 28.7 % (ref 12.0–46.0)
Lymphs Abs: 1.6 10*3/uL (ref 0.7–4.0)
MCHC: 34.5 g/dL (ref 30.0–36.0)
MCV: 92.6 fl (ref 78.0–100.0)
Monocytes Absolute: 0.5 10*3/uL (ref 0.1–1.0)
Monocytes Relative: 8.5 % (ref 3.0–12.0)
Neutro Abs: 3.4 10*3/uL (ref 1.4–7.7)
Neutrophils Relative %: 58.7 % (ref 43.0–77.0)
Platelets: 311 10*3/uL (ref 150.0–400.0)
RBC: 5.31 Mil/uL (ref 4.22–5.81)
RDW: 13.3 % (ref 11.5–15.5)
WBC: 5.7 10*3/uL (ref 4.0–10.5)

## 2022-05-07 LAB — T4, FREE: Free T4: 0.95 ng/dL (ref 0.60–1.60)

## 2022-05-07 LAB — COMPREHENSIVE METABOLIC PANEL
ALT: 50 U/L (ref 0–53)
AST: 22 U/L (ref 0–37)
Albumin: 4.9 g/dL (ref 3.5–5.2)
Alkaline Phosphatase: 57 U/L (ref 39–117)
BUN: 13 mg/dL (ref 6–23)
CO2: 26 mEq/L (ref 19–32)
Calcium: 10.1 mg/dL (ref 8.4–10.5)
Chloride: 99 mEq/L (ref 96–112)
Creatinine, Ser: 1.09 mg/dL (ref 0.40–1.50)
GFR: 89.3 mL/min (ref >60.00)
Glucose, Bld: 99 mg/dL (ref 70–99)
Potassium: 4.4 mEq/L (ref 3.5–5.1)
Sodium: 137 mEq/L (ref 135–145)
Total Bilirubin: 0.8 mg/dL (ref 0.2–1.2)
Total Protein: 7.4 g/dL (ref 6.0–8.3)

## 2022-05-07 LAB — VITAMIN B12: Vitamin B-12: 537 pg/mL (ref 211–911)

## 2022-05-07 LAB — VITAMIN D 25 HYDROXY (VIT D DEFICIENCY, FRACTURES): VITD: 20.9 ng/mL — ABNORMAL LOW (ref 30.00–100.00)

## 2022-05-07 LAB — TSH: TSH: 3.65 u[IU]/mL (ref 0.35–5.50)

## 2022-05-07 LAB — T3, FREE: T3, Free: 5.6 pg/mL — ABNORMAL HIGH (ref 2.3–4.2)

## 2022-05-07 NOTE — Progress Notes (Signed)
Phone [phone] In person visit   Subjective:   [name] is a 32 y.o. year old very pleasant male patient who presents for/with See problem oriented charting Chief Complaint  Patient presents with   Follow-up    Pt is here to f/u on thyroid issues, pt states he has noticed a significant change in his energy level and sex drive and sleep issues.   Past Medical History-  Patient Active Problem List   Diagnosis Date Noted   Hypothyroidism 02/01/2014    Priority: Medium    Back pain 02/03/2018    Priority: Low   Allergic rhinitis 07/14/2008    Priority: Low   Asthma 07/14/2008    Priority: Low   Vitamin D deficiency 05/07/2022    Medications- reviewed and updated Current Outpatient Medications  Medication Sig Dispense Refill   fluticasone (FLONASE) 50 MCG/ACT nasal spray Place 2 sprays into both nostrils daily. 16 g 5   NP THYROID 120 MG tablet Take 1 tablet (120 mg total) by mouth daily before breakfast. 30 tablet 3   TADALAFIL PO Take by mouth. (Patient not taking: Reported on 05/07/2022)     No current facility-administered medications for this visit.     Objective:  BP 100/68   Pulse 97   Temp 98.2 F (36.8 C)   Ht 5\' 10"  (1.778 m)   Wt 242 lb 6.4 oz (110 kg)   SpO2 96%   BMI 34.78 kg/m  Gen: NAD, resting comfortably No thyromegaly, tympanic membrane's normal, oropharynx largely normal CV: RRR no murmurs rubs or gallops Lungs: CTAB no crackles, wheeze, rhonchi Abdomen: soft/nontender/nondistended/normal bowel sounds. No rebound or guarding.  Ext: no edema Skin: warm, dry     Assessment and Plan   #hypothyroidism/hypogonadism S: compliant On thyroid medication-at last visit in 2021 patient was on levothyroxine 88 mcg-he transitioned care to [facility] in 2022 but had also been seeing [facility] in [place] for thyroid replacement and testosterone replacement-Dr. Anastasio Champion transitioned him to NP thyroid and later titrated to 120 mg but  appears testosterone remained cared for by [facility].  Total testosterone was low at 229 on October 08, 2020 but DHEA was slightly high, prolactin was normal. Dr. Anastasio Champion passed away unfortunately.   He has been concerned about fatigue/low libido/insomnia over the last year. He had labs with [facility] in last month and they wanted to increase him to 150 mg of NP thyroid (was going to be $200). Doesn't sleep well and when he does fall asleep has a hard time getting up.  - has tried ED meds through [facility] (cialis or viagra) but doesn't always work.  - for testosterone he is on 0.6 mL per week but not sure of exact rx. Seems to have been a good place for him.  -averaging 6-8 hours of sleep per night but does snore and wakes up un refreshed  Fatigue makes it hard to get to gym but when he does make it no chest pain or shortness of breath  Lab Results  Component Value Date   TSH 3.25 10/30/2020    A/P: Hypothyroidism appears to be an appropriate treatment range based on labs from June of last year but patient reports loose wanted to adjust therapy-he is remain on the current 120 mg dose of NP thyroid and will need another opinion-we opted to check TSH, T3, T4 to see if he truly needs an adjustment of the 150 mg dose was very expensive - Still dealing  with low libido/fatigue/insomnia even if on appropriate treatment range - As first step update full blood work including TSH, T3, T4 as above as well as CBC, CMP and vitamin D.  No history of low B12 issue but will also check that - Discussed potential sleep apnea eval if labs were okay due to daytime somnolence, waking up unrefreshed, snoring   #Vitamin D deficiency S: Medication: has taken MV through [facility] in past but currently off- used to take [facility] Last vitamin D Lab Results  Component Value Date   VD25OH 43 (L) 10/08/2020  A/P: Vitamin D poorly controlled in the past and not currently on medication-update levels and discussed may need  high-dose boost  # Smokeless tobacco/snuff use-need to advise cessation at follow-up/physical in May  Recommended follow up: Return for next already scheduled visit or sooner if needed. Future Appointments  Date Time Provider [place]  09/03/2022 10:00 AM Marin Olp, MD [facility]   Lab/Order associations:   ICD-10-CM   1. Acquired hypothyroidism  E03.9 TSH    T4, free    T3, free    CBC with Differential/Platelet    Comprehensive metabolic panel    2. Vitamin D deficiency  E55.9 VITAMIN D 25 Hydroxy (Vit-D Deficiency, Fractures)    3. Fatigue, unspecified type  R53.83 CBC with Differential/Platelet    Comprehensive metabolic panel    Vitamin B12    4. Need for Tdap vaccination  Z23 Tdap vaccine greater than or equal to 7yo IM    5. Encounter for hepatitis C screening test for low risk patient  Z11.59 Hepatitis C antibody     Return precautions advised.  Garret Reddish, MD

## 2022-05-07 NOTE — Patient Instructions (Addendum)
Tdap today  Please stop by lab before you go If you have mychart- we will send your results within 3 business days of [place] receiving them.  If you do not have mychart- we will call you about results within 5 business days of [place] receiving them.  *please also note that you will see labs on mychart as soon as they post. I will later go in and write notes on them- will say "notes from Dr. Yong Channel"   We can make decisions on next steps once we have results back. Consider sleep apnea eval with pulmonology if labs come back ok on current meds  Recommended follow up: Return for next already scheduled visit or sooner if needed.

## 2022-05-08 ENCOUNTER — Other Ambulatory Visit: Payer: Self-pay | Admitting: Family Medicine

## 2022-05-08 ENCOUNTER — Encounter: Payer: Self-pay | Admitting: Family Medicine

## 2022-05-08 DIAGNOSIS — E039 Hypothyroidism, unspecified: Secondary | ICD-10-CM

## 2022-05-08 LAB — HEPATITIS C ANTIBODY: Hepatitis C Ab: NONREACTIVE

## 2022-05-08 MED ORDER — VITAMIN D (ERGOCALCIFEROL) 1.25 MG (50000 UNIT) PO CAPS: 50000.0000 [IU] | ORAL_CAPSULE | ORAL | 1 refills | Status: AC

## 2022-05-10 MED ORDER — LEVOTHYROXINE SODIUM 88 MCG PO TABS: 88.0000 ug | ORAL_TABLET | Freq: Every day | ORAL | 3 refills | Status: DC

## 2022-05-16 MED ORDER — TADALAFIL 20 MG PO TABS: 20.0000 mg | ORAL_TABLET | ORAL | 11 refills | Status: AC | PRN

## 2022-05-28 DIAGNOSIS — F4322 Adjustment disorder with anxiety: Secondary | ICD-10-CM | POA: Diagnosis not present

## 2022-07-17 DIAGNOSIS — E291 Testicular hypofunction: Secondary | ICD-10-CM | POA: Diagnosis not present

## 2022-07-17 DIAGNOSIS — Z79890 Hormone replacement therapy (postmenopausal): Secondary | ICD-10-CM | POA: Diagnosis not present

## 2022-07-17 DIAGNOSIS — E0590 Thyrotoxicosis, unspecified without thyrotoxic crisis or storm: Secondary | ICD-10-CM | POA: Diagnosis not present

## 2022-07-21 DIAGNOSIS — Z72820 Sleep deprivation: Secondary | ICD-10-CM | POA: Diagnosis not present

## 2022-07-21 DIAGNOSIS — E291 Testicular hypofunction: Secondary | ICD-10-CM | POA: Diagnosis not present

## 2022-07-21 DIAGNOSIS — Z6830 Body mass index (BMI) 30.0-30.9, adult: Secondary | ICD-10-CM | POA: Diagnosis not present

## 2022-07-21 DIAGNOSIS — R6882 Decreased libido: Secondary | ICD-10-CM | POA: Diagnosis not present

## 2022-07-22 DIAGNOSIS — E291 Testicular hypofunction: Secondary | ICD-10-CM | POA: Diagnosis not present

## 2022-07-23 ENCOUNTER — Ambulatory Visit: Payer: Self-pay | Admitting: Physician Assistant

## 2022-07-23 ENCOUNTER — Encounter: Payer: Self-pay | Admitting: Physician Assistant

## 2022-07-23 VITALS — BP 137/74 | HR 64 | Temp 97.6°F | Resp 14

## 2022-07-23 DIAGNOSIS — R0989 Other specified symptoms and signs involving the circulatory and respiratory systems: Secondary | ICD-10-CM

## 2022-07-23 DIAGNOSIS — J019 Acute sinusitis, unspecified: Secondary | ICD-10-CM

## 2022-07-23 LAB — POC COVID19 BINAXNOW: SARS Coronavirus 2 Ag: NEGATIVE

## 2022-07-23 LAB — POCT INFLUENZA A/B
Influenza A, POC: NEGATIVE
Influenza B, POC: NEGATIVE

## 2022-07-23 MED ORDER — PREDNISONE 20 MG PO TABS: 40.0000 mg | ORAL_TABLET | Freq: Every day | ORAL | 0 refills | Status: DC

## 2022-07-23 NOTE — Progress Notes (Signed)
   Subjective:    Patient ID: [name], male    DOB: 08/17/1990, 32 y.o.   MRN: [id number]  URI  This is a new problem. The current episode started in the past 7 days. The problem has been gradually worsening. The maximum temperature recorded prior to his arrival was 100.4 - 100.9 F. The fever has been present for Less than 1 day. Associated symptoms include congestion, coughing and sinus pain. Pertinent negatives include no abdominal pain, chest pain, diarrhea, dysuria, ear pain, headaches, joint pain, joint swelling, nausea, neck pain, plugged ear sensation, rash, rhinorrhea, sneezing, sore throat, swollen glands, vomiting or wheezing. He has tried acetaminophen and decongestant for the symptoms. The treatment provided no relief.      Review of Systems  Constitutional: Negative.   HENT:  Positive for congestion and sinus pain. Negative for ear pain, rhinorrhea, sneezing and sore throat.   Eyes: Negative.   Respiratory:  Positive for cough. Negative for wheezing.   Cardiovascular: Negative.  Negative for chest pain.  Gastrointestinal:  Negative for abdominal pain, diarrhea, nausea and vomiting.  Genitourinary:  Negative for dysuria.  Musculoskeletal:  Negative for joint pain and neck pain.  Skin:  Negative for rash.  Neurological:  Negative for headaches.       Objective:   Physical Exam Vitals and nursing note reviewed.  Constitutional:      Appearance: He is not toxic-appearing.  HENT:     Head: Normocephalic and atraumatic.     Ears:     Comments: Mild fluid behind the TM. Mild increase redness of the EAC. No swelling or drainage.    Nose: Congestion present.     Mouth/Throat:     Mouth: Mucous membranes are moist.     Comments: Uvula enlarged Eyes:     Conjunctiva/sclera: Conjunctivae normal.     Pupils: Pupils are equal, round, and reactive to light.  Cardiovascular:     Rate and Rhythm: Normal rate and regular rhythm.  Pulmonary:     Effort: Pulmonary effort  is normal.     Breath sounds: Normal breath sounds.  Musculoskeletal:        General: Normal range of motion.     Cervical back: Normal range of motion and neck supple.  Skin:    General: Skin is warm.     Capillary Refill: Capillary refill takes less than 2 seconds.     Findings: No rash.  Neurological:     General: No focal deficit present.     Mental Status: He is alert.           Assessment & Plan: Acute sinusitis  2. Upper Respiratory Infection.     Plan: Exam suggest sinusitis and upper respiratory infection.  Influenza test is negative. COVID test is negative.  We will suggest increasing fluids, washing hands frequently, and using a mask until symptoms resolve. Use Allegra D every 12 hurs. Use Prednisone 40mg  daily. Use Tylenol or Ibuprofen for body aches and/or fever. Return to clinic if not improving.

## 2022-07-23 NOTE — Patient Instructions (Signed)
Exam suggest sinusitis and upper respiratory infection.  Influenza test is negative. COVID test is negative.  We will suggest increasing fluids, washing hands frequently, and using a mask until symptoms resolve. Use Allegra D every 12 hurs. Use Prednisone 40mg  daily. Use Tylenol or Ibuprofen for body aches and/or fever.

## 2022-09-03 ENCOUNTER — Ambulatory Visit (INDEPENDENT_AMBULATORY_CARE_PROVIDER_SITE_OTHER): Payer: BC Managed Care – PPO | Admitting: Family Medicine

## 2022-09-03 ENCOUNTER — Encounter: Payer: Self-pay | Admitting: Family Medicine

## 2022-09-03 VITALS — BP 122/80 | HR 76 | Temp 98.2°F | Ht 70.0 in | Wt 230.0 lb

## 2022-09-03 DIAGNOSIS — E669 Obesity, unspecified: Secondary | ICD-10-CM | POA: Diagnosis not present

## 2022-09-03 DIAGNOSIS — Z0000 Encounter for general adult medical examination without abnormal findings: Secondary | ICD-10-CM

## 2022-09-03 DIAGNOSIS — E785 Hyperlipidemia, unspecified: Secondary | ICD-10-CM | POA: Diagnosis not present

## 2022-09-03 DIAGNOSIS — E559 Vitamin D deficiency, unspecified: Secondary | ICD-10-CM

## 2022-09-03 DIAGNOSIS — M20011 Mallet finger of right finger(s): Secondary | ICD-10-CM

## 2022-09-03 DIAGNOSIS — Z131 Encounter for screening for diabetes mellitus: Secondary | ICD-10-CM | POA: Diagnosis not present

## 2022-09-03 DIAGNOSIS — E039 Hypothyroidism, unspecified: Secondary | ICD-10-CM

## 2022-09-03 LAB — LIPID PANEL
Cholesterol: 222 mg/dL — ABNORMAL HIGH (ref 0–200)
HDL: 59 mg/dL (ref >39.00)
LDL Cholesterol: 141 mg/dL — ABNORMAL HIGH (ref 0–99)
NonHDL: 162.93
Total CHOL/HDL Ratio: 4
Triglycerides: 111 mg/dL (ref 0.0–149.0)
VLDL: 22.2 mg/dL (ref 0.0–40.0)

## 2022-09-03 LAB — COMPREHENSIVE METABOLIC PANEL
ALT: 82 U/L — ABNORMAL HIGH (ref 0–53)
AST: 36 U/L (ref 0–37)
Albumin: 4.9 g/dL (ref 3.5–5.2)
Alkaline Phosphatase: 58 U/L (ref 39–117)
BUN: 13 mg/dL (ref 6–23)
CO2: 29 mEq/L (ref 19–32)
Calcium: 9.9 mg/dL (ref 8.4–10.5)
Chloride: 98 mEq/L (ref 96–112)
Creatinine, Ser: 1.07 mg/dL (ref 0.40–1.50)
GFR: 91.1 mL/min (ref >60.00)
Glucose, Bld: 82 mg/dL (ref 70–99)
Potassium: 4.4 mEq/L (ref 3.5–5.1)
Sodium: 136 mEq/L (ref 135–145)
Total Bilirubin: 0.8 mg/dL (ref 0.2–1.2)
Total Protein: 7.6 g/dL (ref 6.0–8.3)

## 2022-09-03 LAB — TSH: TSH: 5.11 u[IU]/mL (ref 0.35–5.50)

## 2022-09-03 LAB — CBC WITH DIFFERENTIAL/PLATELET
Basophils Absolute: 0.1 10*3/uL (ref 0.0–0.1)
Basophils Relative: 1 % (ref 0.0–3.0)
Eosinophils Absolute: 0.3 10*3/uL (ref 0.0–0.7)
Eosinophils Relative: 3.7 % (ref 0.0–5.0)
HCT: 46 % (ref 39.0–52.0)
Hemoglobin: 15.7 g/dL (ref 13.0–17.0)
Lymphocytes Relative: 15 % (ref 12.0–46.0)
Lymphs Abs: 1.3 10*3/uL (ref 0.7–4.0)
MCHC: 34.1 g/dL (ref 30.0–36.0)
MCV: 92.4 fl (ref 78.0–100.0)
Monocytes Absolute: 1.1 10*3/uL — ABNORMAL HIGH (ref 0.1–1.0)
Monocytes Relative: 12.7 % — ABNORMAL HIGH (ref 3.0–12.0)
Neutro Abs: 5.8 10*3/uL (ref 1.4–7.7)
Neutrophils Relative %: 67.6 % (ref 43.0–77.0)
Platelets: 275 10*3/uL (ref 150.0–400.0)
RBC: 4.98 Mil/uL (ref 4.22–5.81)
RDW: 13.7 % (ref 11.5–15.5)
WBC: 8.6 10*3/uL (ref 4.0–10.5)

## 2022-09-03 LAB — T4, FREE: Free T4: 0.8 ng/dL (ref 0.60–1.60)

## 2022-09-03 LAB — VITAMIN D 25 HYDROXY (VIT D DEFICIENCY, FRACTURES): VITD: 28.78 ng/mL — ABNORMAL LOW (ref 30.00–100.00)

## 2022-09-03 LAB — HEMOGLOBIN A1C: Hgb A1c MFr Bld: 5.4 % (ref 4.6–6.5)

## 2022-09-03 LAB — T3, FREE: T3, Free: 3.8 pg/mL (ref 2.3–4.2)

## 2022-09-03 NOTE — Patient Instructions (Addendum)
Short term "sleep reset" with melatonin for 2 weeks -try to have consistent bedtime within abotu 15 minutes for these 2 weeks - stop ALL screens 30 minutes before bed- do something without screens that is not too mentally stimulating- leisure reading, folding socks, time with pets - at the same time you stop screens start to turn the lights down -also at the same time take melatonin 1-3 mg - if not doing better after about 2 weeks let me know  Please stop by lab before you go If you have mychart- we will send your results within 3 business days of [place] receiving them.  If you do not have mychart- we will call you about results within 5 business days of [place] receiving them.  *please also note that you will see labs on mychart as soon as they post. I will later go in and write notes on them- will say "notes from Dr. Durene Cal"   Schedule dentist follow up   Urgent referral to Dr. Amanda Pea for his opinion- team please update him before he leaves
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
## 2022-09-08 DIAGNOSIS — M79644 Pain in right finger(s): Secondary | ICD-10-CM | POA: Diagnosis not present

## 2022-09-08 DIAGNOSIS — M20011 Mallet finger of right finger(s): Secondary | ICD-10-CM | POA: Diagnosis not present

## 2022-09-15 DIAGNOSIS — M25641 Stiffness of right hand, not elsewhere classified: Secondary | ICD-10-CM | POA: Diagnosis not present

## 2022-09-25 ENCOUNTER — Encounter: Payer: Self-pay | Admitting: Family Medicine

## 2022-09-25 ENCOUNTER — Ambulatory Visit: Payer: BC Managed Care – PPO | Admitting: Family Medicine

## 2022-09-25 VITALS — BP 132/80 | HR 91 | Temp 97.8°F | Ht 70.0 in | Wt 241.8 lb

## 2022-09-25 DIAGNOSIS — R0683 Snoring: Secondary | ICD-10-CM | POA: Diagnosis not present

## 2022-09-25 DIAGNOSIS — E669 Obesity, unspecified: Secondary | ICD-10-CM

## 2022-09-25 DIAGNOSIS — E559 Vitamin D deficiency, unspecified: Secondary | ICD-10-CM

## 2022-09-25 NOTE — Patient Instructions (Addendum)
We have placed a referral for you today to pulmonology and healthy weight of the wellness. you will see # listed below- you can call this if you have not heard within a week. Reach out to [place] if you ar enot scheduled within 2 weeks  Sign release of information at the check out desk for [facility] - check back with [place] perhaps 3-4 weeks from now to see if we got the records- requesting all records from them  Recommended follow up: Return for next already scheduled visit or sooner if needed.

## 2022-09-25 NOTE — Progress Notes (Signed)
Phone [phone] In person visit   Subjective:   [name] is a 32 y.o. year old very pleasant male patient who presents for/with See problem oriented charting Chief Complaint  Patient presents with   Insomnia    Pt wife states it takes for ever for pt to fall asleep, can sleep 12 hours and still feels like he hasn't slept. The only time he gets good sleep is after having alcohol   testosterone    Wants to discuss you taking this over instead of [facility]    Past Medical History-  Patient Active Problem List   Diagnosis Date Noted   Hypothyroidism 02/01/2014    Priority: Medium    Back pain 02/03/2018    Priority: Low   Allergic rhinitis 07/14/2008    Priority: Low   Asthma 07/14/2008    Priority: Low   Vitamin D deficiency 05/07/2022    Medications- reviewed and updated Current Outpatient Medications  Medication Sig Dispense Refill   fluticasone (FLONASE) 50 MCG/ACT nasal spray Place 2 sprays into both nostrils daily. 16 g 5   levothyroxine (SYNTHROID) 88 MCG tablet Take 1 tablet (88 mcg total) by mouth daily. 90 tablet 3   tadalafil (CIALIS) 20 MG tablet Take 1 tablet (20 mg total) by mouth every other day as needed for erectile dysfunction. 10 tablet 11   Vitamin D, Ergocalciferol, (DRISDOL) 1.25 MG (50000 UNIT) CAPS capsule Take 1 capsule (50,000 Units total) by mouth every 7 (seven) days. 13 capsule 1   No current facility-administered medications for this visit.     Objective:  BP 132/80   Pulse 91   Temp 97.8 F (36.6 C)   Ht 5\' 10"  (1.778 m)   Wt 241 lb 12.8 oz (109.7 kg)   SpO2 96%   BMI 34.69 kg/m  Gen: NAD, resting comfortably     Assessment and Plan    # Insomnia/poor sleep S: Patient with significant difficulty falling asleep.  Even if he sleeps for 12 hours he still wakes up unrefreshed.  He feels like he only sleeps well if he drinks alcohol before bed- ut snores heavily (normally snores but much louder after alcohol). Comes home  extremely tired- doesn't always feel like going to the gym as he did in the past as sleep has worsened.   Last visit patient reported waking up 3-7 times a night- still having that issues.  He was watching TV until bedtime within 10 minutes.  We attempted a sleep reset with melatonin low-dose for [redacted] weeks along with no screen time 30 minutes before bedtime- he was diligent about this but did not notice substantial improvement with sleep A/P: with ongoing sleep issues and newly reported snoring - we opted to refer to pulmonology for evaluation of possible OSA- wonder if poor sleep could also affect metabolsim and difficulty losing weight.      #Vitamin D deficiency S: Medication: Required high-dose vitamin D-currently on 50,000 units every 7 days- he is still on that so hopefully improving Last vitamin D Lab Results  Component Value Date   VD25OH 28.78 (L) 09/03/2022  A/P: suspect improving- he does think he missed a eek before last visit which may have contributed- hold off on repeating at this time    # Hypogonadism-on testosterone replacement through [facility]  S: medication: testosterone cypionate 0.6 to 0.7 cc of what we think is 200 mg/cc. - when testosterone has low in past- lower libido/less morning erections, fatigue A/P: patient requests that  I take prescription- we are going to try to get records- I only have one low reading on our records and will likely need more than that for insurance purposes. -aware of fertility concerns with treatment but was unable to find clomid at pharmacies- does want to maintain fertility though   -could consider endocrine consult but limited options  # Obesity S:started at [facility] for trouble to lose weight. He feels eats somewhat clean but  continues to struggle to lose weight.  -reading last visit was apparently from morning at home. Today was on our scale.  - has been trying 5 meals a day-severe caloric deficits as low as 1600- now is around 2100  Wt  Readings from Last 3 Encounters:  09/25/22 241 lb 12.8 oz (109.7 kg)  09/03/22 230 lb (104.3 kg)  05/07/22 242 lb 6.4 oz (110 kg)  A/P: per BMI is obese but he has excellent muscle mass so I do not think this is an appropriate measurement for him- regardless hed like help in maximizing diet and trimming some fat- we referred to healthy weight [facility] wellness program within [facility]  -Encouraged need for healthy eating, regular exercise, weight loss.   #Mallet finger- working with [facility]  Recommended follow up: Return for next already scheduled visit or sooner if needed. Future Appointments  Date Time Provider Department Center  09/04/2023 10:00 AM Shelva Majestic, MD [facility]    Lab/Order associations:   ICD-10-CM   1. Snoring  R06.83 Ambulatory referral to Pulmonology    2. Obesity (BMI 30-39.9)  E66.9 Amb Ref to Medical Weight Management    3. Vitamin D deficiency  E55.9       Time Spent: 32 minutes of total time (12:57 AM- 1:29 PM) was spent on the date of the encounter performing the following actions: chart review prior to seeing the patient, obtaining history, performing a medically necessary exam, counseling on the treatment plan and his concerns about fatigue/libido/fertility/etc, placing orders, and documenting in our EHR.    Return precautions advised.  Tana Conch, MD

## 2022-09-26 NOTE — Telephone Encounter (Signed)
See below

## 2022-10-10 NOTE — Telephone Encounter (Signed)
Called and spoke with Irving Burton at [facility], she states they do not have a specific medica records department. They have his request for them to release records to [place] however they are behind and have a lot of other request infront of him which is the delay. She would not fax over anything while I was on the phone but states they will be getting to his request soon.

## 2022-10-20 DIAGNOSIS — M20011 Mallet finger of right finger(s): Secondary | ICD-10-CM | POA: Diagnosis not present

## 2022-10-23 NOTE — Telephone Encounter (Signed)
I called and spoke with Irving Burton again and was told the same as below. She was not able to update me on the status or how much longer it will take before they reach his record. I have called and lm for pt tcb.

## 2022-11-21 DIAGNOSIS — F4322 Adjustment disorder with anxiety: Secondary | ICD-10-CM | POA: Diagnosis not present

## 2022-11-26 ENCOUNTER — Ambulatory Visit (INDEPENDENT_AMBULATORY_CARE_PROVIDER_SITE_OTHER): Payer: BC Managed Care – PPO | Admitting: Nurse Practitioner

## 2022-11-26 ENCOUNTER — Encounter: Payer: Self-pay | Admitting: Nurse Practitioner

## 2022-11-26 VITALS — BP 120/68 | HR 60 | Temp 97.8°F | Ht 72.0 in | Wt 238.4 lb

## 2022-11-26 DIAGNOSIS — R0683 Snoring: Secondary | ICD-10-CM | POA: Diagnosis not present

## 2022-11-26 DIAGNOSIS — Z6832 Body mass index (BMI) 32.0-32.9, adult: Secondary | ICD-10-CM | POA: Diagnosis not present

## 2022-11-26 DIAGNOSIS — G4719 Other hypersomnia: Secondary | ICD-10-CM | POA: Diagnosis not present

## 2022-11-26 DIAGNOSIS — E6609 Other obesity due to excess calories: Secondary | ICD-10-CM

## 2022-11-26 DIAGNOSIS — E669 Obesity, unspecified: Secondary | ICD-10-CM | POA: Insufficient documentation

## 2022-11-26 DIAGNOSIS — E66811 Obesity, class 1: Secondary | ICD-10-CM | POA: Insufficient documentation
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
## 2022-11-26 NOTE — Assessment & Plan Note (Signed)
See above

## 2022-11-26 NOTE — Assessment & Plan Note (Signed)
He has snoring, excessive daytime sleepiness, restless sleep. BMI 32. Epworth 17. Given this,  I am concerned he could have sleep disordered breathing with obstructive sleep apnea. He will need sleep study for further evaluation.    - discussed how weight can impact sleep and risk for sleep disordered breathing - discussed options to assist with weight loss: combination of diet modification, cardiovascular and strength training exercises   - had an extensive discussion regarding the adverse health consequences related to untreated sleep disordered breathing - specifically discussed the risks for hypertension, coronary artery disease, cardiac dysrhythmias, cerebrovascular disease, and diabetes - lifestyle modification discussed   - discussed how sleep disruption can increase risk of accidents, particularly when driving - safe driving practices were discussed  Patient Instructions  Given your symptoms, I am concerned that you may have sleep disordered breathing with sleep apnea. You will need a sleep study for further evaluation. Someone will contact you to schedule this.   We discussed how untreated sleep apnea puts an individual at risk for cardiac arrhthymias, pulm HTN, DM, stroke and increases their risk for daytime accidents. We also briefly reviewed treatment options including weight loss, side sleeping position, oral appliance, CPAP therapy or referral to ENT for possible surgical options  Use caution when driving and pull over if you become sleepy.  Follow up in 6 weeks with Katie Brandy Kabat,NP to go over sleep study results, or sooner, if needed

## 2022-11-26 NOTE — Patient Instructions (Addendum)
Given your symptoms, I am concerned that you may have sleep disordered breathing with sleep apnea. You will need a sleep study for further evaluation. Someone will contact you to schedule this.   We discussed how untreated sleep apnea puts an individual at risk for cardiac arrhthymias, pulm HTN, DM, stroke and increases their risk for daytime accidents. We also briefly reviewed treatment options including weight loss, side sleeping position, oral appliance, CPAP therapy or referral to ENT for possible surgical options  Use caution when driving and pull over if you become sleepy.  Follow up in 6 weeks with Ian Cobb,NP to go over sleep study results, or sooner, if needed   

## 2022-11-26 NOTE — Assessment & Plan Note (Signed)
BMI 32. Healthy weight loss encouraged.

## 2022-12-12 DIAGNOSIS — F4322 Adjustment disorder with anxiety: Secondary | ICD-10-CM | POA: Diagnosis not present

## 2022-12-15 DIAGNOSIS — G4730 Sleep apnea, unspecified: Secondary | ICD-10-CM | POA: Diagnosis not present

## 2023-01-01 ENCOUNTER — Telehealth: Payer: Self-pay | Admitting: Pulmonary Disease

## 2023-01-01 ENCOUNTER — Encounter (INDEPENDENT_AMBULATORY_CARE_PROVIDER_SITE_OTHER): Payer: BC Managed Care – PPO

## 2023-01-01 DIAGNOSIS — R0683 Snoring: Secondary | ICD-10-CM

## 2023-01-01 DIAGNOSIS — G4719 Other hypersomnia: Secondary | ICD-10-CM

## 2023-01-01 DIAGNOSIS — G4733 Obstructive sleep apnea (adult) (pediatric): Secondary | ICD-10-CM

## 2023-01-01 NOTE — Telephone Encounter (Signed)
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  CPAP therapy if there is significant daytime sleepiness or other comorbidities including history of CVA or cardiac disease  -If CPAP is chosen as an option of treatment auto titrating CPAP with a pressure setting of 5-15 will be appropriate  2.  Watchful waiting with emphasis on weight loss measures, sleep position modification to optimize lateral sleep, elevating the head of the bed by about 30 degrees may also help.  3.  An oral device may be fashioned for the treatment of mild sleep disordered breathing, will involve referral to dentist.  Follow-up as previously scheduled

## 2023-01-06 NOTE — Telephone Encounter (Signed)
Patient has a office visit with Katie on 01/08/2023 to go over sleep result's .

## 2023-01-08 ENCOUNTER — Ambulatory Visit (INDEPENDENT_AMBULATORY_CARE_PROVIDER_SITE_OTHER): Payer: BC Managed Care – PPO | Admitting: Nurse Practitioner

## 2023-01-08 ENCOUNTER — Encounter: Payer: Self-pay | Admitting: Nurse Practitioner

## 2023-01-08 VITALS — BP 110/80 | HR 66 | Ht 72.0 in | Wt 242.2 lb

## 2023-01-08 DIAGNOSIS — G4700 Insomnia, unspecified: Secondary | ICD-10-CM | POA: Insufficient documentation

## 2023-01-08 DIAGNOSIS — G4733 Obstructive sleep apnea (adult) (pediatric): Secondary | ICD-10-CM

## 2023-01-08 NOTE — Patient Instructions (Signed)
Your sleep study showed mild sleep apnea   Start auto CPAP 5-15 cmH2O every night, minimum of 4-6 hours a night.  Change equipment as directed. Wash your tubing with warm soap and water daily, hang to dry. Wash humidifier portion weekly. Use bottled, distilled water and change daily  Be aware of reduced alertness and do not drive or operate heavy machinery if experiencing this or drowsiness.  Exercise encouraged, as tolerated. Notify if persistent daytime sleepiness occurs even with consistent use of CPAP.  We discussed how untreated sleep apnea puts an individual at risk for cardiac arrhthymias, pulm HTN, DM, stroke and increases their risk for daytime accidents. We also briefly reviewed treatment options including weight loss, side sleeping position, oral appliance, CPAP therapy   Let me know if you are still having trouble with your sleep after starting CPAP  Follow up in 10-12 weeks with Dr. Wynona Neat or Philis Nettle, or sooner, if needed
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
## 2023-01-08 NOTE — Assessment & Plan Note (Signed)
Mild OSA with AHI 6.5/h. He has significant daytime symptoms. Shared decision to trial PAP therapy. We will start him on auto CPAP 5-15 cmH2O, mask of choice and heated humidity. Educated on proper use/care of device. Aware of safe driving practices. Will re-evaluate his sleep maintenance after initiating therapy and determine if further pharmacological therapy is warranted.  Patient Instructions  Your sleep study showed mild sleep apnea   Start auto CPAP 5-15 cmH2O every night, minimum of 4-6 hours a night.  Change equipment as directed. Wash your tubing with warm soap and water daily, hang to dry. Wash humidifier portion weekly. Use bottled, distilled water and change daily  Be aware of reduced alertness and do not drive or operate heavy machinery if experiencing this or drowsiness.  Exercise encouraged, as tolerated. Notify if persistent daytime sleepiness occurs even with consistent use of CPAP.  We discussed how untreated sleep apnea puts an individual at risk for cardiac arrhthymias, pulm HTN, DM, stroke and increases their risk for daytime accidents. We also briefly reviewed treatment options including weight loss, side sleeping position, oral appliance, CPAP therapy   Let me know if you are still having trouble with your sleep after starting CPAP  Follow up in 10-12 weeks with Dr. Wynona Neat or Philis Nettle, or sooner, if needed

## 2023-01-08 NOTE — Assessment & Plan Note (Signed)
 See above. Sleep hygiene reviewed.

## 2023-01-21 NOTE — Telephone Encounter (Signed)
Patient seen [name] on 01/08/23 and CPAP was ordered.Nothing else further needed.

## 2023-01-30 ENCOUNTER — Other Ambulatory Visit: Payer: Self-pay

## 2023-01-30 DIAGNOSIS — R7989 Other specified abnormal findings of blood chemistry: Secondary | ICD-10-CM

## 2023-01-30 DIAGNOSIS — G4733 Obstructive sleep apnea (adult) (pediatric): Secondary | ICD-10-CM | POA: Diagnosis not present

## 2023-01-30 NOTE — Telephone Encounter (Signed)
Pt has been scheduled for 10/3 @ 9 am for lab. Pt has been informed to fast and verbalized understanding.

## 2023-01-30 NOTE — Telephone Encounter (Signed)
Unable to reach pt , lvm requesting call back to schedule

## 2023-02-04 DIAGNOSIS — F4322 Adjustment disorder with anxiety: Secondary | ICD-10-CM | POA: Diagnosis not present

## 2023-02-05 ENCOUNTER — Other Ambulatory Visit: Payer: BC Managed Care – PPO

## 2023-02-09 ENCOUNTER — Other Ambulatory Visit: Payer: BC Managed Care – PPO

## 2023-02-12 ENCOUNTER — Other Ambulatory Visit: Payer: BC Managed Care – PPO

## 2023-02-13 ENCOUNTER — Other Ambulatory Visit: Payer: BC Managed Care – PPO

## 2023-02-13 ENCOUNTER — Ambulatory Visit: Payer: BC Managed Care – PPO | Admitting: Family

## 2023-02-13 ENCOUNTER — Encounter: Payer: Self-pay | Admitting: Family

## 2023-02-13 VITALS — BP 120/78 | HR 91 | Temp 98.2°F | Ht 72.0 in | Wt 243.2 lb

## 2023-02-13 DIAGNOSIS — R7989 Other specified abnormal findings of blood chemistry: Secondary | ICD-10-CM | POA: Diagnosis not present

## 2023-02-13 DIAGNOSIS — F321 Major depressive disorder, single episode, moderate: Secondary | ICD-10-CM | POA: Diagnosis not present

## 2023-02-13 LAB — TESTOSTERONE: Testosterone: 197.8 ng/dL — ABNORMAL LOW (ref 300.00–890.00)

## 2023-02-13 MED ORDER — FLUOXETINE HCL 10 MG PO TABS
10.0000 mg | ORAL_TABLET | Freq: Every day | ORAL | 1 refills | Status: DC
Start: 2023-02-13 — End: 2023-02-19
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
## 2023-02-19 ENCOUNTER — Telehealth: Payer: Self-pay | Admitting: Family Medicine

## 2023-02-19 MED ORDER — FLUOXETINE HCL 10 MG PO CAPS: 10.0000 mg | ORAL_CAPSULE | Freq: Every day | ORAL | 3 refills | Status: DC

## 2023-02-19 NOTE — Telephone Encounter (Signed)
Pts wife called and states the RX  FLUoxetine (PROZAC) 10 MG tablet  Needs to be capsules not tablet for insurance to pay.

## 2023-02-19 NOTE — Telephone Encounter (Signed)
Rx switched to capsules.

## 2023-02-20 ENCOUNTER — Other Ambulatory Visit (HOSPITAL_COMMUNITY): Payer: Self-pay

## 2023-03-01 DIAGNOSIS — G4733 Obstructive sleep apnea (adult) (pediatric): Secondary | ICD-10-CM | POA: Diagnosis not present

## 2023-03-13 ENCOUNTER — Ambulatory Visit: Payer: BC Managed Care – PPO | Admitting: Family

## 2023-03-13 ENCOUNTER — Encounter: Payer: Self-pay | Admitting: Family

## 2023-03-13 VITALS — BP 116/71 | HR 62 | Temp 97.8°F | Ht 72.0 in | Wt 251.0 lb

## 2023-03-13 DIAGNOSIS — F321 Major depressive disorder, single episode, moderate: Secondary | ICD-10-CM | POA: Diagnosis not present

## 2023-03-13 DIAGNOSIS — R7989 Other specified abnormal findings of blood chemistry: Secondary | ICD-10-CM
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
## 2023-03-19 ENCOUNTER — Encounter: Payer: Self-pay | Admitting: Nurse Practitioner

## 2023-03-19 ENCOUNTER — Ambulatory Visit: Payer: BC Managed Care – PPO | Admitting: Nurse Practitioner

## 2023-03-19 VITALS — BP 117/72 | HR 91 | Temp 97.8°F | Ht 72.0 in | Wt 248.4 lb

## 2023-03-19 DIAGNOSIS — E66811 Obesity, class 1: Secondary | ICD-10-CM

## 2023-03-19 DIAGNOSIS — G4733 Obstructive sleep apnea (adult) (pediatric): Secondary | ICD-10-CM

## 2023-03-19 NOTE — Assessment & Plan Note (Signed)
 BMI 33. Healthy weight loss encouraged.

## 2023-03-19 NOTE — Assessment & Plan Note (Signed)
Mild OSA, on CPAP.  Good compliance and excellent control on download.  He has had significant benefit from therapy with resolving daytime fatigue symptoms and improved energy levels.  Encouraged him to continue utilizing nightly.  Aware of proper care/use of device.  Safe driving practices reviewed.  Healthy weight loss encouraged.  Recommended he try mask liners for skin irritation.  If this does not help, he could also try switching over to a memory foam mask.  Advised him to contact us if he has any further issues with his masks.  Patient Instructions  Continue to use CPAP every night, minimum of 4-6 hours a night.  Change equipment as directed. Wash your tubing with warm soap and water daily, hang to dry. Wash humidifier portion weekly. Use bottled, distilled water and change daily Be aware of reduced alertness and do not drive or operate heavy machinery if experiencing this or drowsiness.  Exercise encouraged, as tolerated. Healthy weight management discussed.  Avoid or decrease alcohol consumption and medications that make you more sleepy, if possible. Notify if persistent daytime sleepiness occurs even with consistent use of PAP therapy.  Try mask liners to help protect your skin. You can get these off [employer]. They have disposable or reusable ones   Follow up in 6 months with Katie Jamar Weatherall,NP, or sooner, if needed

## 2023-03-19 NOTE — Patient Instructions (Signed)
Continue to use CPAP every night, minimum of 4-6 hours a night.  Change equipment as directed. Wash your tubing with warm soap and water daily, hang to dry. Wash humidifier portion weekly. Use bottled, distilled water and change daily Be aware of reduced alertness and do not drive or operate heavy machinery if experiencing this or drowsiness.  Exercise encouraged, as tolerated. Healthy weight management discussed.  Avoid or decrease alcohol consumption and medications that make you more sleepy, if possible. Notify if persistent daytime sleepiness occurs even with consistent use of PAP therapy.  Try mask liners to help protect your skin. You can get these off [employer]. They have disposable or reusable ones   Follow up in 6 months with Katie Avyn Coate,NP, or sooner, if needed
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
## 2023-03-20 DIAGNOSIS — Z3141 Encounter for fertility testing: Secondary | ICD-10-CM | POA: Diagnosis not present

## 2023-03-27 ENCOUNTER — Encounter: Payer: Self-pay | Admitting: Family Medicine

## 2023-03-27 ENCOUNTER — Telehealth (INDEPENDENT_AMBULATORY_CARE_PROVIDER_SITE_OTHER): Payer: BC Managed Care – PPO | Admitting: Family Medicine

## 2023-03-27 DIAGNOSIS — F325 Major depressive disorder, single episode, in full remission: Secondary | ICD-10-CM | POA: Diagnosis not present

## 2023-03-27 DIAGNOSIS — E039 Hypothyroidism, unspecified: Secondary | ICD-10-CM

## 2023-03-27 DIAGNOSIS — E291 Testicular hypofunction: Secondary | ICD-10-CM | POA: Diagnosis not present

## 2023-03-27 DIAGNOSIS — R7989 Other specified abnormal findings of blood chemistry: Secondary | ICD-10-CM

## 2023-03-27 NOTE — Progress Notes (Signed)
Phone [phone] Virtual visit via Video note   Subjective:  Chief complaint: Chief Complaint  Patient presents with   Follow-up    Pt states this visit is only to f/u and discuss testosterone lab.     Our team/I connected with Mila Homer at  1:20 PM EST by a video enabled telemedicine application (caregility through epic) and verified that I am speaking with the correct person using two identifiers. Our team/I discussed the limitations of evaluation and management by telemedicine and the availability of in person appointments.No physical exam was performed (except for noted visual exam or audio findings with Telehealth visits).   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 90 capsule 3   fluticasone (FLONASE) 50 MCG/ACT nasal spray Place 2 sprays into both nostrils daily. 16 g 5   levothyroxine (SYNTHROID) 88 MCG tablet Take 1 tablet (88 mcg total) by mouth daily. 90 tablet 3   tadalafil (CIALIS) 20 MG tablet Take 1 tablet (20 mg total) by mouth every other day as needed for erectile dysfunction. 10 tablet 11   Vitamin D, Ergocalciferol, (DRISDOL) 1.25 MG (50000 UNIT) CAPS capsule Take 1 capsule (50,000 Units total) by mouth every 7 (seven) days. 13 capsule 1   No  current facility-administered medications for this visit.     Objective:  There were no vitals taken for this visit. self reported vitals Gen: NAD, resting comfortably Lungs: nonlabored, normal respiratory rate  Skin: appears dry, no obvious rash     Assessment and Plan   #hypothyroidism S: compliant On thyroid medication-levothyroxine 88 mcg Lab Results  Component Value Date   TSH 5.11 09/03/2022  A/P:stable- continue current medicines  - fatigue has been consistent since being off testosterone even with normal thyroid levels- I do not think this is the cause of his fatigue   # Hypogonadism-on testosterone replacement through [facility]  in past- last note from March 2024 (in system) S:he notes low sex drive, low energy levels even with better sleep on CPAP.  Did well with injections in past-improved sex drive/imaging. Now off for 6 months  Reviewed note from 07/21/2022 when he had been off of treatment for some time which showed total testosterone at 99, free testosterone at 5.2, sex hormone binding globulin at 14.7-all 3 levels were low.  Follow-up labs from 07/22/2022 showed LH less than 0.3 with reference range 1.7-8.6 mIU/mL and FSH of 0.5 with reference range 1.5-12.4 mIU/mL.  Prolactin and cortisol were in normal range  Semen analysis done recently as part of wife's infertility workup (they have had trouble conceiving)- normal motility but there was deformity and they could tell he had been on testosterone. Sperm count normal. Was tolerate higher risk of abnormality for birth defects with current deformity of  sperm- was told to stay off medicine and recheck.  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 despite low testosterone with [facility] in April - With how low his testosterone has been off treatment I told him I thought we needed an MRI of the brain -Also with abnormal sperm and potential for congenital birth defect per the report he was given I do not  recommend restarting testosterone at this time as they are trying to pursue having a child together (him and his wife) until after conception -With his fatigue and low energy he would like to start treatment as soon as possible (after conception and if MRI of the brain reassuring)  # Depression S: Medication:Prozac 10 mg     03/27/2023   12:51 PM 02/13/2023    1:12 PM 09/25/2022   12:51 PM  Depression screen PHQ 2/9  Decreased Interest 0 3 1  Down, Depressed, Hopeless 0 2 0  PHQ - 2 Score 0 5 1  Altered sleeping 0 0 2  Tired, decreased energy 0 0 2  Change in appetite 0 2 1  Feeling bad or failure about yourself  0 2 0  Trouble concentrating 0 1 0  Moving slowly or fidgety/restless 0 0 0  Suicidal thoughts 0 0 0  PHQ-9 Score 0 10 6  Difficult doing work/chores Not difficult at all Very difficult Somewhat difficult  A/P: doing well - full remission- reassess in 6 months- continue current medications for now   #Vitamin D deficiency S: Medication: was trending up at last visit- he was finishing up high dose vitamin D- did take other vitamin D but has run out Last vitamin D Lab Results  Component Value Date   VD25OH 28.78 (L) 09/03/2022  A/P: hopefully controlled- encouraged to restart 2000 units per day of vitamin D   Recommended follow up: keep may visit Future Appointments  Date Time Provider Department Center  09/04/2023 10:00 AM Shelva Majestic, MD [facility]    Lab/Order associations:   ICD-10-CM   1. 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 Low serum follicle stimulating hormone [facility])  R79.89 MR Brain W Wo Contrast    3. Low serum luteinizing hormone (LH)  R79.89 MR Brain W Wo Contrast    4. Major depressive disorder with single episode, in full remission (HCC)  F32.5     5. Acquired hypothyroidism  E03.9       No orders of the defined types were placed in this encounter.   Return precautions advised.  Tana Conch, MD

## 2023-03-27 NOTE — Patient Instructions (Signed)
We will call you within two weeks about your referral for MRI brain through [facility].  Their phone number is [phone].  Please call them if you have not heard in 1-2 weeks   Recommended follow up: No follow-ups on file.

## 2023-04-01 DIAGNOSIS — G4733 Obstructive sleep apnea (adult) (pediatric): Secondary | ICD-10-CM | POA: Diagnosis not present

## 2023-04-22 ENCOUNTER — Ambulatory Visit
Admission: RE | Admit: 2023-04-22 | Discharge: 2023-04-22 | Disposition: A | Discharge status: Home / Self Care | Payer: BC Managed Care – PPO | Source: Ambulatory Visit | Attending: Family Medicine | Admitting: Family Medicine

## 2023-04-22 DIAGNOSIS — R7989 Other specified abnormal findings of blood chemistry: Secondary | ICD-10-CM

## 2023-04-22 DIAGNOSIS — R9089 Other abnormal findings on diagnostic imaging of central nervous system: Secondary | ICD-10-CM | POA: Diagnosis not present

## 2023-04-22 DIAGNOSIS — E291 Testicular hypofunction: Secondary | ICD-10-CM

## 2023-04-22 MED ORDER — GADOPICLENOL 0.5 MMOL/ML IV SOLN
10.0000 mL | Freq: Once | INTRAVENOUS | Status: AC | PRN
  Administered 2023-04-22: 10 mL via INTRAVENOUS

## 2023-05-01 DIAGNOSIS — G4733 Obstructive sleep apnea (adult) (pediatric): Secondary | ICD-10-CM | POA: Diagnosis not present

## 2023-05-11 ENCOUNTER — Other Ambulatory Visit: Payer: Self-pay | Admitting: Family Medicine

## 2023-05-11 DIAGNOSIS — R9089 Other abnormal findings on diagnostic imaging of central nervous system: Secondary | ICD-10-CM

## 2023-05-11 DIAGNOSIS — E230 Hypopituitarism: Secondary | ICD-10-CM

## 2023-05-27 ENCOUNTER — Telehealth: Payer: Self-pay

## 2023-05-27 ENCOUNTER — Other Ambulatory Visit: Payer: Self-pay | Admitting: Family Medicine

## 2023-05-27 MED ORDER — LEVOTHYROXINE SODIUM 88 MCG PO TABS: 88.0000 ug | ORAL_TABLET | Freq: Every day | ORAL | 3 refills | Status: AC

## 2023-05-27 NOTE — Telephone Encounter (Signed)
Copied from CRM [id number]. Topic: Clinical - Medication Refill >> May 27, 2023 11:42 AM Elizebeth Brooking wrote: Most Recent Primary Care Visit:  Provider: Shelva Majestic  Department: [facility]  Visit Type: MYCHART VIDEO VISIT  Date: 03/27/2023  Medication: levothyroxine (SYNTHROID) 88 MCG tablet  Has the patient contacted their pharmacy? Yes , stated he will need to contact physician  (Agent: If no, request that the patient contact the pharmacy for the refill. If patient does not wish to contact the pharmacy document the reason why and proceed with request.) (Agent: If yes, when and what did the pharmacy advise?)  Is this the correct pharmacy for this prescription? Yes If no, delete pharmacy and type the correct one.  This is the patient's preferred pharmacy:  [id number] - [place], [place] - [address] [place] [address] Phone: [phone] Fax: [phone]  [id number] - [place] [facility] - [address] [place] [address] Phone: [phone] Fax: [phone]   Has the prescription been filled recently? No  Is the patient out of the medication? Yes  Has the patient been seen for an appointment in the last year OR does the patient have an upcoming appointment? Yes  Can we respond through MyChart? Yes  Agent: Please be advised that Rx refills may take up to 3 business days. We ask that you follow-up with your pharmacy.

## 2023-05-27 NOTE — Telephone Encounter (Signed)
Please call neurosurgery directly and read them off the MRI brainreport "Arachnoid herniation into the sella. The sella is enlarged and the pituitary tissue is flattened along the floor. Overall measurements of the gland are 15 mm right to left, 15 mm front to back, and 2 mm in thickness. The gland enhances in a homogeneous fashion, therefore no evidence of mass lesion. Infundibulum is slightly to the left of midline, not felt to be significant. This can be a normal variant or could be associated with intracranial hypertension."  I have spoken to endocrine previously about this and they recommended neurosurgery consult for these findings as well

## 2023-05-27 NOTE — Telephone Encounter (Signed)
See below.  Copied from CRM [id number]. Topic: Referral - Question >> May 27, 2023 11:39 AM Elizebeth Brooking wrote: Reason for CRM: Patient called in stating once they made a appointment for the neurology the doctor inform them that the matter he has he needs to see a endocrinologist and canceld his appointment . Is requesting a callback

## 2023-05-28 NOTE — Telephone Encounter (Signed)
I spoke with Misty Stanley and she states that the patient was scheduled for yesterday and the provider had to cancel due to an emergency but is back today so i was told the coordinator at [facility] will call him back to reschedule hopefully today.

## 2023-05-28 NOTE — Telephone Encounter (Signed)
I had reached out to Neurosurgery and was unsuccessful and had to leave a vm. I have also made our referral coordinator Misty Stanley aware of this and she will be attempting to reach out as well to see if she can get through to anyone.

## 2023-06-02 DIAGNOSIS — G4733 Obstructive sleep apnea (adult) (pediatric): Secondary | ICD-10-CM | POA: Diagnosis not present

## 2023-06-02 DIAGNOSIS — R400 Somnolence: Secondary | ICD-10-CM | POA: Diagnosis not present

## 2023-06-22 DIAGNOSIS — M62830 Muscle spasm of back: Secondary | ICD-10-CM | POA: Diagnosis not present

## 2023-06-22 DIAGNOSIS — M546 Pain in thoracic spine: Secondary | ICD-10-CM | POA: Diagnosis not present

## 2023-06-22 DIAGNOSIS — M9903 Segmental and somatic dysfunction of lumbar region: Secondary | ICD-10-CM | POA: Diagnosis not present

## 2023-06-22 DIAGNOSIS — M9902 Segmental and somatic dysfunction of thoracic region: Secondary | ICD-10-CM | POA: Diagnosis not present

## 2023-06-24 ENCOUNTER — Encounter (INDEPENDENT_AMBULATORY_CARE_PROVIDER_SITE_OTHER): Payer: Self-pay

## 2023-06-25 ENCOUNTER — Encounter (INDEPENDENT_AMBULATORY_CARE_PROVIDER_SITE_OTHER): Payer: Self-pay | Admitting: Family Medicine

## 2023-06-25 DIAGNOSIS — Z3189 Encounter for other procreative management: Secondary | ICD-10-CM | POA: Diagnosis not present

## 2023-07-09 ENCOUNTER — Encounter (INDEPENDENT_AMBULATORY_CARE_PROVIDER_SITE_OTHER): Payer: Self-pay

## 2023-09-04 ENCOUNTER — Encounter: Payer: BC Managed Care – PPO | Admitting: Family Medicine

## 2023-09-23 ENCOUNTER — Encounter: Payer: Self-pay | Admitting: Family Medicine

## 2023-11-30 ENCOUNTER — Ambulatory Visit: Admitting: Family

## 2023-11-30 ENCOUNTER — Ambulatory Visit: Payer: Self-pay

## 2023-11-30 ENCOUNTER — Encounter: Payer: Self-pay | Admitting: Physician Assistant

## 2023-11-30 ENCOUNTER — Ambulatory Visit: Payer: Self-pay | Admitting: Physician Assistant

## 2023-11-30 VITALS — BP 134/76 | HR 60 | Temp 97.5°F | Resp 17 | Ht 71.0 in | Wt 235.0 lb

## 2023-11-30 DIAGNOSIS — J209 Acute bronchitis, unspecified: Secondary | ICD-10-CM

## 2023-11-30 DIAGNOSIS — J019 Acute sinusitis, unspecified: Secondary | ICD-10-CM

## 2023-11-30 LAB — POCT INFLUENZA A/B

## 2023-11-30 LAB — POCT RAPID STREP A (OFFICE): Rapid Strep A Screen: NEGATIVE

## 2023-11-30 MED ORDER — AZITHROMYCIN 250 MG PO TABS
ORAL_TABLET | ORAL | 0 refills | Status: DC
Start: 2023-11-30 — End: 2023-11-30

## 2023-11-30 MED ORDER — AZITHROMYCIN 250 MG PO TABS: ORAL_TABLET | ORAL | 0 refills | Status: DC

## 2023-11-30 NOTE — Telephone Encounter (Signed)
 Appt today

## 2023-11-30 NOTE — Progress Notes (Signed)
  Subjective:     Patient ID: [name], male   DOB: 1991-04-24, 33 y.o.   MRN: [id number]  Sinusitis This is a new problem. The current episode started in the past 7 days. The problem has been gradually worsening since onset. There has been no fever. Associated symptoms include congestion, coughing, headaches, sinus pressure and a sore throat. Pertinent negatives include no chills, diaphoresis, ear pain, neck pain, shortness of breath or sneezing. Past treatments include acetaminophen  and saline nose sprays. The treatment provided no relief.     Review of Systems  Constitutional:  Negative for chills and diaphoresis.  HENT:  Positive for congestion, sinus pressure and sore throat. Negative for ear pain and sneezing.   Respiratory:  Positive for cough. Negative for shortness of breath.   Musculoskeletal:  Negative for neck pain.  Neurological:  Positive for headaches.       Objective:   Physical Exam Constitutional:      Appearance: Normal appearance.  HENT:     Head: Normocephalic and atraumatic.     Right Ear: Ear canal normal.     Left Ear: Tympanic membrane and ear canal normal.     Ears:     Comments: Mild fluid behind the right TM.    Nose: Congestion present.     Mouth/Throat:     Mouth: Mucous membranes are dry.     Comments: Uvula enlarged. Eyes:     Conjunctiva/sclera: Conjunctivae normal.     Pupils: Pupils are equal, round, and reactive to light.  Cardiovascular:     Rate and Rhythm: Normal rate and regular rhythm.     Pulses: Normal pulses.  Pulmonary:     Breath sounds: No wheezing.     Comments: Soft wheeze and rhonchi on ascultation Abdominal:     General: Abdomen is flat.  Musculoskeletal:        General: Normal range of motion.     Cervical back: Normal range of motion.  Lymphadenopathy:     Cervical: No cervical adenopathy.  Skin:    General: Skin is warm.     Capillary Refill: Capillary refill takes less than 2 seconds.  Neurological:      General: No focal deficit present.     Mental Status: He is alert.        Assessment:    1. Sinusitis 2. Bronchitis    Plan:     Vital signs reviewed. Oxygen level 97% on room air. Exam is consistent with sinusitis and bronchitis. Please increase fluids. Use tylenol  and/or ibuprofen for headache or fever. Use Allegra D every 12 hours for congestion. Use Zithromax  daily until all taken. Use mask until symptoms resolve. Return to clinic if any changes or problems.

## 2023-11-30 NOTE — Telephone Encounter (Signed)
 FYI Only or Action Required?: Action required by provider: request for appointment.  Patient was last seen in primary care on 03/27/2023 by Katrinka Garnette KIDD, MD.  Called Nurse Triage reporting Cough.  Symptoms began a week ago.  Interventions attempted: OTC medications: cough medication.  Symptoms are: unchanged.  Triage Disposition: See Physician Within 24 Hours  Patient/caregiver understands and will follow disposition?: YesCopied from CRM [id number]. Topic: Clinical - Red Word Triage >> Nov 30, 2023  8:06 AM Elle L wrote: Red Word that prompted transfer to Nurse Triage: The patient's partner states that the patient has a productive cough with discolored brown mucous. He has been using over the counter medication but it is getting worse. Reason for Disposition  SEVERE coughing spells (e.g., whooping sound after coughing, vomiting after coughing)  Answer Assessment - Initial Assessment Questions 1. ONSET: When did the cough begin?      Last monday 2. SEVERITY: How bad is the cough today?      Keeps pt up at night 3. SPUTUM: Describe the color of your sputum (e.g., none, dry cough; clear, white, yellow, green)     Green-white/brown 4. HEMOPTYSIS: Are you coughing up any blood? If Yes, ask: How much? (e.g., flecks, streaks, tablespoons, etc.)     denies 5. DIFFICULTY BREATHING: Are you having difficulty breathing? If Yes, ask: How bad is it? (e.g., mild, moderate, severe)      denies 6. FEVER: Do you have a fever? If Yes, ask: What is your temperature, how was it measured, and when did it start?     denies 7. CARDIAC HISTORY: Do you have any history of heart disease? (e.g., heart attack, congestive heart failure)      denies 8. LUNG HISTORY: Do you have any history of lung disease?  (e.g., pulmonary embolus, asthma, emphysema)     asthma 9. PE RISK FACTORS: Do you have a history of blood clots? (or: recent major surgery, recent prolonged travel, bedridden)      denies 10. OTHER SYMPTOMS: Do you have any other symptoms? (e.g., runny nose, wheezing, chest pain)       Some wheezing    Pt uses C-pap and thought was cause due to humidly level but corrected and symptoms have not changed. Pt does SOB with exercise but not with ADLs. Pt has tried OTC cold medication and not addressing symptoms.  Protocols used: Cough - Acute Productive-A-AH

## 2023-11-30 NOTE — Patient Instructions (Signed)
 Vital signs reviewed. Oxygen level 97% on room air. Exam is consistent with sinusitis and bronchitis. Please increase fluids. Use tylenol  and/or ibuprofen for headache or fever. Use Allegra D every 12 hours for congestion. Use Zithromax  daily until all taken. Use mask until symptoms resolve. Return to clinic if any changes or problems.

## 2023-12-09 NOTE — Telephone Encounter (Signed)
 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

## 2023-12-10 NOTE — Telephone Encounter (Signed)
 Left a voicemail with [name]'s care team at [facility] neurosurgery regarding Dr. San remarks and requested a call back.

## 2023-12-11 DIAGNOSIS — Z3141 Encounter for fertility testing: Secondary | ICD-10-CM | POA: Diagnosis not present

## 2023-12-11 DIAGNOSIS — Z0183 Encounter for blood typing: Secondary | ICD-10-CM | POA: Diagnosis not present

## 2023-12-11 DIAGNOSIS — Z1159 Encounter for screening for other viral diseases: Secondary | ICD-10-CM | POA: Diagnosis not present

## 2023-12-11 DIAGNOSIS — N469 Male infertility, unspecified: Secondary | ICD-10-CM | POA: Diagnosis not present

## 2023-12-24 DIAGNOSIS — G4733 Obstructive sleep apnea (adult) (pediatric): Secondary | ICD-10-CM | POA: Diagnosis not present

## 2023-12-24 DIAGNOSIS — R400 Somnolence: Secondary | ICD-10-CM | POA: Diagnosis not present

## 2024-01-01 ENCOUNTER — Telehealth: Payer: Self-pay

## 2024-01-01 ENCOUNTER — Telehealth (INDEPENDENT_AMBULATORY_CARE_PROVIDER_SITE_OTHER): Admitting: Nurse Practitioner

## 2024-01-01 ENCOUNTER — Encounter: Payer: Self-pay | Admitting: Nurse Practitioner

## 2024-01-01 DIAGNOSIS — G4733 Obstructive sleep apnea (adult) (pediatric): Secondary | ICD-10-CM
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
## 2024-01-01 NOTE — Patient Instructions (Signed)
 Continue to use CPAP every night, minimum of 4-6 hours a night.  Change equipment as directed. Wash your tubing with warm soap and water daily, hang to dry. Wash humidifier portion weekly. Use bottled, distilled water and change daily Be aware of reduced alertness and do not drive or operate heavy machinery if experiencing this or drowsiness.  Exercise encouraged, as tolerated. Healthy weight management discussed.  Avoid or decrease alcohol consumption and medications that make you more sleepy, if possible. Notify if persistent daytime sleepiness occurs even with consistent use of PAP therapy.  Change CPAP supplies... Every month Mask cushions and/or nasal pillows CPAP machine filters Every 3 months Mask frame (not including the headgear) CPAP tubing Every 6 months Mask headgear Chin strap (if applicable) Humidifier water tub  Follow up in one year with Ian Tishia Maestre,NP, or sooner, if needed

## 2024-01-01 NOTE — Assessment & Plan Note (Signed)
 Mild OSA, on CPAP.  Good compliance and excellent control on download.  He has had significant benefit from therapy with resolving daytime fatigue symptoms and improved energy levels.  Encouraged him to continue utilizing nightly.  Aware of proper care/use of device.  Safe driving practices reviewed.  Healthy weight loss encouraged.    Patient Instructions  Continue to use CPAP every night, minimum of 4-6 hours a night.  Change equipment as directed. Wash your tubing with warm soap and water daily, hang to dry. Wash humidifier portion weekly. Use bottled, distilled water and change daily Be aware of reduced alertness and do not drive or operate heavy machinery if experiencing this or drowsiness.  Exercise encouraged, as tolerated. Healthy weight management discussed.  Avoid or decrease alcohol consumption and medications that make you more sleepy, if possible. Notify if persistent daytime sleepiness occurs even with consistent use of PAP therapy.  Change CPAP supplies... Every month Mask cushions and/or nasal pillows CPAP machine filters Every 3 months Mask frame (not including the headgear) CPAP tubing Every 6 months Mask headgear Chin strap (if applicable) Humidifier water tub  Follow up in one year with Katie Yaron Grasse,NP, or sooner, if needed

## 2024-01-01 NOTE — Telephone Encounter (Signed)
 Called pt to ask if he can move his virtual appt any earlier.

## 2024-01-13 DIAGNOSIS — M62830 Muscle spasm of back: Secondary | ICD-10-CM | POA: Diagnosis not present

## 2024-01-13 DIAGNOSIS — M546 Pain in thoracic spine: Secondary | ICD-10-CM | POA: Diagnosis not present

## 2024-01-13 DIAGNOSIS — M9903 Segmental and somatic dysfunction of lumbar region: Secondary | ICD-10-CM | POA: Diagnosis not present

## 2024-01-13 DIAGNOSIS — M9902 Segmental and somatic dysfunction of thoracic region: Secondary | ICD-10-CM | POA: Diagnosis not present

## 2024-03-09 ENCOUNTER — Telehealth: Payer: Self-pay

## 2024-03-09 ENCOUNTER — Telehealth: Payer: Self-pay | Admitting: Family Medicine

## 2024-03-09 DIAGNOSIS — E230 Hypopituitarism: Secondary | ICD-10-CM

## 2024-03-09 DIAGNOSIS — E291 Testicular hypofunction: Secondary | ICD-10-CM

## 2024-03-09 DIAGNOSIS — R9089 Other abnormal findings on diagnostic imaging of central nervous system: Secondary | ICD-10-CM

## 2024-03-09 NOTE — Telephone Encounter (Signed)
 Messaged with Dr. Janjua- since we have not had success hearing back from [facility] on this issue he is open to seeing patient and evaluating for potential effects  I am still considering adrenal imaging before treatment or referral for testosterone  deficiency

## 2024-03-09 NOTE — Telephone Encounter (Signed)
 Received cmn from [facility],faxed succesfully

## 2024-03-15 ENCOUNTER — Encounter: Payer: Self-pay | Admitting: Neurosurgery

## 2024-03-15 ENCOUNTER — Ambulatory Visit: Admitting: Neurosurgery

## 2024-03-15 VITALS — BP 111/71 | HR 78 | Temp 97.6°F | Ht 72.0 in | Wt 244.0 lb

## 2024-03-15 DIAGNOSIS — H47093 Other disorders of optic nerve, not elsewhere classified, bilateral: Secondary | ICD-10-CM

## 2024-03-15 DIAGNOSIS — E236 Other disorders of pituitary gland: Secondary | ICD-10-CM

## 2024-03-15 DIAGNOSIS — H538 Other visual disturbances: Secondary | ICD-10-CM
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
## 2024-03-22 ENCOUNTER — Ambulatory Visit: Admitting: Neurosurgery

## 2024-03-22 ENCOUNTER — Encounter: Payer: Self-pay | Admitting: Neurosurgery

## 2024-03-22 VITALS — BP 126/74 | HR 71 | Temp 97.8°F | Ht 72.0 in | Wt 247.4 lb

## 2024-03-22 DIAGNOSIS — E236 Other disorders of pituitary gland: Secondary | ICD-10-CM

## 2024-03-22 DIAGNOSIS — H539 Unspecified visual disturbance: Secondary | ICD-10-CM | POA: Diagnosis not present

## 2024-03-22 DIAGNOSIS — R9402 Abnormal brain scan: Secondary | ICD-10-CM

## 2024-03-22 NOTE — Progress Notes (Signed)
 33 year old gentleman with a peculiar story of visual loss which has been unexplained.  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  I requested him to bring the eye reports from his eye doctors from 2021 and the more recent one with him.  I reviewed the notes and the funduscopy done recently did not show any papilledema.  However, the report from 2021 does mention some disc edema and the left eye.  Going back, that visit prompted a referral to the ophthalmology department at [facility] where he has evaluated by Dr. Jerel Lunger, the now retired professor of neuro-ophthalmology, who did not see any papilledema but did feel that the problem life behind the globe and recommended an MRI of the brain and the orbits.  At that time, the patient did not have the funds to get these MRIs done and this was never completed.  More recently, an MRI was done which showed this empty sella however this was done for the workup of his low testosterone  levels.  On this MRI, the empty sella was found.  I went over these findings with him and he confirmed what has been stated above.  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  I have noted this in my previous clinic note.  If he does have elevated intracranial pressures, then he will need to be treated with medication or more aggressively with an intervention because if elevated intracranial pressures are causing his visual problems, this can be progressive and result in permanent visual loss which can be irreversible.  I explained this in great detail to him.  He will get a lumbar puncture and abstain from Valsalva maneuvers from weightlifting afterward for a week.  I also explained to him that it would be difficult to  monitor medical treatment and its benefits in the absence of any headaches but we will cross that bridge at that time.

## 2024-04-06 NOTE — Discharge Instructions (Signed)

## 2024-04-07 ENCOUNTER — Inpatient Hospital Stay
Admission: RE | Admit: 2024-04-07 | Discharge: 2024-04-07 | Disposition: A | Discharge status: Home / Self Care | Source: Ambulatory Visit | Attending: Neurosurgery | Admitting: Neurosurgery

## 2024-04-12 ENCOUNTER — Ambulatory Visit: Admitting: Neurosurgery

## 2024-04-21 ENCOUNTER — Other Ambulatory Visit: Payer: Self-pay | Admitting: Gastroenterology

## 2024-04-21 ENCOUNTER — Ambulatory Visit: Admission: RE | Admit: 2024-04-21 | Source: Ambulatory Visit

## 2024-04-21 ENCOUNTER — Ambulatory Visit: Admission: EM | Admit: 2024-04-21 | Source: Ambulatory Visit

## 2024-04-21 DIAGNOSIS — M25561 Pain in right knee: Secondary | ICD-10-CM

## 2024-04-21 DIAGNOSIS — M25461 Effusion, right knee: Secondary | ICD-10-CM | POA: Insufficient documentation

## 2024-05-07 ENCOUNTER — Other Ambulatory Visit: Payer: Self-pay | Admitting: Family Medicine
